# Patient Record
Sex: Male | Born: 1960 | Race: Black or African American | Hispanic: No | Marital: Married | Smoking: Never smoker
Health system: Southern US, Community
[De-identification: ages and names within clinical notes are randomized; demographics above are authoritative.]

## PROBLEM LIST (undated history)

## (undated) DIAGNOSIS — I509 Heart failure, unspecified: Secondary | ICD-10-CM

## (undated) DIAGNOSIS — I10 Essential (primary) hypertension: Secondary | ICD-10-CM

## (undated) DIAGNOSIS — I639 Cerebral infarction, unspecified: Secondary | ICD-10-CM

## (undated) DIAGNOSIS — E119 Type 2 diabetes mellitus without complications: Secondary | ICD-10-CM

## (undated) HISTORY — PX: COLON SURGERY: SHX602

## (undated) HISTORY — DX: Cerebral infarction, unspecified: I63.9

## (undated) HISTORY — DX: Heart failure, unspecified: I50.9

## (undated) HISTORY — PX: ABDOMINAL SURGERY: SHX537

---

## 2011-09-09 NOTE — ED Provider Notes (Signed)
HPI Comments: Patient ran out of his blood pressure medication two weeks ago.  He had a primary doctor, but he passed away, and he still hasn't followed up with a new doctor yet. He is supposed to take Amlodipine 5 mg daily, and Lisinopril 40 mg daily.  Since he ran out he has been getting occasional headaches that go away when he takes Tylenol, lays down, and rests for a little while. He doesn't want to be without his blood pressure medication any longer. No fever, chills, chest pain, SOB, difficulty breathing, current headache, nausea, vomiting, diaphoresis, or other symptoms or complaints.    Patient is a 51 y.o. male presenting with medication refill. The history is provided by the patient.   Medication Refill  This is a new problem. The current episode started more than 1 week ago. The problem occurs constantly. The problem has not changed since onset.Associated symptoms include headaches. Pertinent negatives include no chest pain, no abdominal pain and no shortness of breath. Nothing aggravates the symptoms. The symptoms are relieved by rest, acetaminophen and laying down. He has tried nothing for the symptoms.        Past Medical History   Diagnosis Date   ??? Hypertension         History reviewed. No pertinent past surgical history.      No family history on file.     History     Social History   ??? Marital Status: LEGALLY SEPARATED     Spouse Name: N/A     Number of Children: N/A   ??? Years of Education: N/A     Occupational History   ??? Not on file.     Social History Main Topics   ??? Smoking status: Not on file   ??? Smokeless tobacco: Not on file   ??? Alcohol Use:    ??? Drug Use:    ??? Sexually Active:      Other Topics Concern   ??? Not on file     Social History Narrative   ??? No narrative on file                  ALLERGIES: Review of patient's allergies indicates no known allergies.      Review of Systems   Constitutional: Negative for fever and chills.   HENT: Negative for neck pain and neck stiffness.    Eyes:  Negative for visual disturbance.   Respiratory: Negative for cough and shortness of breath.    Cardiovascular: Negative for chest pain and leg swelling.   Gastrointestinal: Negative for nausea, vomiting, abdominal pain, diarrhea and constipation.   Genitourinary: Negative for flank pain.   Musculoskeletal: Negative for back pain.   Skin: Negative for rash.   Neurological: Positive for headaches.   Hematological: Negative for adenopathy.   Psychiatric/Behavioral: Negative for agitation.       Filed Vitals:    09/09/11 2042   BP: 156/97   Pulse: 76   Temp: 98 ??F (36.7 ??C)   Resp: 16   Height: 6\' 2"  (1.88 m)   Weight: 147.419 kg (325 lb)   SpO2: 100%            Physical Exam   Nursing note and vitals reviewed.  Constitutional: He is oriented to person, place, and time. He appears well-developed and well-nourished. No distress.   Cardiovascular: Normal rate, regular rhythm, normal heart sounds and intact distal pulses.  Exam reveals no gallop and no friction rub.  No murmur heard.  Pulmonary/Chest: Effort normal and breath sounds normal. No respiratory distress. He has no wheezes. He has no rales.   Neurological: He is alert and oriented to person, place, and time.   Skin: Skin is warm and dry. No rash noted. He is not diaphoretic.   Psychiatric: He has a normal mood and affect. His behavior is normal. Judgment and thought content normal.        MDM     Differential Diagnosis; Clinical Impression; Plan:     Medication refill, hypertension.  Amount and/or Complexity of Data Reviewed:   Clinical lab tests:  Ordered and reviewed  Risk of Significant Complications, Morbidity, and/or Mortality:   Presenting problems:  Low  Diagnostic procedures:  Minimal  Management options:  Low  Progress:   Patient progress:  Stable      Procedures    9:31 PM  Reviewed treatment, medication, lab results, and follow up plan with patient. Answered patient's questions.  Patient is ready for discharge.    Recent Results (from the past 12  hour(s))   POC CHEM8    Collection Time    09/09/11  9:10 PM       Component Value Range    CO2 (POC) 25 (*) 19 - 24 MMOL/L    Glucose (POC) 104  74 - 106 MG/DL    BUN (POC) 16  7 - 18 MG/DL    Creatinine (POC) 1.2  0.6 - 1.3 MG/DL    GFR-AA (POC) >84  >69 ml/min/1.63m2    GFR, non-AA (POC) >60  >60 ml/min/1.59m2    Sodium (POC) 142  136 - 145 MMOL/L    Potassium (POC) 3.7  3.5 - 5.5 MMOL/L    Calcium, ionized (POC) 1.28  1.12 - 1.32 MMOL/L    Chloride (POC) 105  100 - 108 MMOL/L    Anion gap (POC) 16  10 - 20      Hematocrit (POC) 46  37.0 - 49.0 %    Hemoglobin (POC) 15.6  13.0 - 16.0 G/DL     Diagnosis:   1. Hypertension    2. Medication refill          Disposition: Home    Follow-up Information     Follow up With Details Comments Contact Info    Lincoln Brigham., DO in 1 week For blood pressure evaluation [address]  [address]  [facility]  [place] [address]  [phone]            Patient's Medications   Start Taking    AMLODIPINE (NORVASC) 5 MG TABLET    Take 1 Tab by mouth daily.    LISINOPRIL (PRINIVIL, ZESTRIL) 40 MG TABLET    Take 1 Tab by mouth daily.   Continue Taking    No medications on file   These Medications have changed    No medications on file   Stop Taking    AMLODIPINE (NORVASC) 5 MG TABLET    Take 5 mg by mouth daily.    LISINOPRIL (PRINIVIL, ZESTRIL) 40 MG TABLET    Take 40 mg by mouth daily.

## 2011-09-09 NOTE — ED Notes (Signed)
I have reviewed discharge instructions with the patient.  The patient verbalized understanding.

## 2011-09-09 NOTE — ED Notes (Signed)
POC Chem 8 completed PA Salyers given results

## 2011-09-09 NOTE — ED Notes (Signed)
Pt has no primary MD

## 2011-09-09 NOTE — ED Provider Notes (Signed)
I was personally available for consultation in the emergency department.  I have reviewed the chart and agree with the documentation recorded by the MLP, including the assessment, treatment plan, and disposition.  Sommer Spickard D Danner Paulding, MD

## 2011-09-10 LAB — POC CHEM8
Anion gap, POC: 16 (ref 10–20)
BUN, POC: 16 MG/DL (ref 7–18)
CO2, POC: 25 MMOL/L — ABNORMAL HIGH (ref 19–24)
Calcium, ionized (POC): 1.28 MMOL/L (ref 1.12–1.32)
Chloride, POC: 105 MMOL/L (ref 100–108)
Creatinine, POC: 1.2 MG/DL (ref 0.6–1.3)
GFRAA, POC: >60 mL/min/{1.73_m2} (ref >60)
GFRNA, POC: >60 mL/min/{1.73_m2} (ref >60)
Glucose, POC: 104 MG/DL (ref 74–106)
Hematocrit, POC: 46 % (ref 37.0–49.0)
Hemoglobin, POC: 15.6 G/DL (ref 13.0–16.0)
Potassium, POC: 3.7 MMOL/L (ref 3.5–5.5)
Sodium, POC: 142 MMOL/L (ref 136–145)

## 2011-09-10 MED ORDER — LISINOPRIL 40 MG TAB
40 mg | ORAL_TABLET | Freq: Every day | ORAL | Status: DC
Start: 2011-09-10 — End: 2014-02-24

## 2011-09-10 MED ORDER — AMLODIPINE 5 MG TAB
5 mg | ORAL_TABLET | Freq: Every day | ORAL | Status: DC
Start: 2011-09-10 — End: 2015-10-17

## 2011-09-28 NOTE — ED Provider Notes (Signed)
KNOWN ALLERGIES   NKDA       TRIAGE (Mon Sep 28, 2011 22:19 AVM0)   PATIENT: NAME: Jesse Ballard, AGE: 51, GENDER: male, DOB: Tue         12-May-1960, TIME OF GREET: Mon Sep 28, 2011 22:05, Nellie:         [place], [place]: [id number], KG WEIGHT: 145.1, MEDICAL RECORD NUMBER:         [phone], ACCOUNT NUMBER: [id number], PCP: Pt Denies,. (Mon Sep 28, 2011         22:19 AVM0)   ADMISSION: URGENCY: 4, TRANSPORT: Ambulatory, DEPT: Emergency,         BED: WAITING. (Mon Sep 28, 2011 22:19 AVM0)   VITAL SIGNS: BP 159/98, Pulse 89, Resp 16, Temp 99, Pain 8, O2         Sat 96, on Room air, Time 09/28/2011 22:16. (22:16 AVM0)   COMPLAINT:  Back Pain. (Mon Sep 28, 2011 22:19 AVM0)   PRESENTING COMPLAINT:  back pain w/ mvmt. (22:20 AVM0)   PAIN: Patient complains of pain, On a scale 0-10 patient rates         pain as 8, Onset was 0800, No modifying factors. (22:20 AVM0)   TB SCREENING: TB screen not applicable for this patient. (22:20         AVM0)   ABUSE SCREENING: Not Applicable. (22:20 AVM0)   FALL RISK: Patient has a low risk of falling. (22:20 AVM0)   SUICIDAL IDEATION: Not Applicable. (22:20 AVM0)   ADVANCE DIRECTIVES: Unknown if patient has advance directives.         (22:20 AVM0)   PROVIDERS: TRIAGE NURSE: Amy Minnick, RN. (Mon Sep 28, 2011 22:19         AVM0)       PRESENTING PROBLEM (22:19 AVM0)      Presenting problems: Back Injury-Pain-Swelling.       CURRENT MEDICATIONS (22:19 AVM0)   HTN med x2- unk names       NURSING PROCEDURE: DISCHARGE NOTE (23:07 WAB1)   DISCHARGE: Patient discharged to home, ambulating without         assistance, driving self, unaccompanied, Summary of Care printed/         provided, Discharge instructions given to patient, Simple or moderate         discharge teaching performed, by w.bennetch RN, Prescriptions given         and instructions on side effects given, Name of prescription(s)         given: flexeril, ibuprofen, vicodin, Above person(s) verbalized          understanding of discharge instructions and follow-up care, Patient         treated and evaluated by physician.       DIAGNOSIS (22:57 COL1)   FINAL: PRIMARY: Thoracic strain (T-spine strain).       DISPOSITION   PATIENT:  Disposition Type: Discharged, Disposition: Discharge,         Condition: Stable. (22:57 COL1)      Patient left the department. (23:08 WAB1)       VITAL SIGNS (22:16 AVM0)   VITAL SIGNS: BP: 159/98, Pulse: 89, Resp: 16, Temp: 99, Pain: 8,         O2 sat: 96 on Room air, Time: 09/28/2011 22:16.       INSTRUCTION (22:58 COL1)   DISCHARGE:  THORACIC STRAIN - WITHOUT X-RAYS (BACK STRAIN, PULLED         MUSCLE).   FOLLOWUP:  [facility], [address]         [place], [place] [address], [phone].   SPECIAL:  Ibuprofen for pain and inflammation.         Vicodin for SEVERE pain.         Flexeril for muscle spasms.         Avoid heavy lifting.         Follow up with primary care physician in 1 week if not improving.         Do not drive or operate machinery while medicated on Flexeril or         Vicodin.         Return to the ER if condition worsens or new symptoms develop.       PRESCRIPTION (22:57 COL1)   Flexeril:  Tablet : 10 mg : Oral : Quantity: *** 1 *** Unit:         tab(s) Route: Oral Schedule: See Notes Dispense: *** 15 ***.   NOTES:  TID prn muscle spasms. Max: 30 mg/day          No refills          May use generic.   Ibuprofen:  Tablet : 600 Mg : Oral : Quantity: *** 1 *** Unit:         tab(s) Route: Oral Schedule: As needed every 6 hours Dispense: *** 30         ***.   NOTES:  TID with food prn pain          No refills          May use generic.   Vicodin:  Tablet : 500 mg-5 mg : Oral : Quantity: *** 1 *** Unit:         tab Route: Oral Schedule: Q6HPRN Dispense: *** 12 ***.   NOTES:  prn severe pain          No refills          May use generic.       ADMIN (23:08 WAB1)   DIGITAL SIGNATURE:  Bennetch, RN, Whitney.   Key:      AVM0=Minnick, RN, Amy  COL1=OLeary, PA-C, Colleen  WAB1=Bennetch, RN,     [employer]

## 2011-11-07 LAB — TROPONIN I: Troponin-I, QT: <0.02 NG/ML (ref 0.0–0.045)

## 2011-11-07 MED ORDER — METOCLOPRAMIDE 5 MG/ML IJ SOLN
5 mg/mL | INTRAMUSCULAR | Status: AC
Start: 2011-11-07 — End: 2011-11-07
  Administered 2011-11-07: 20:00:00 via INTRAVENOUS

## 2011-11-07 MED ORDER — DIPHENHYDRAMINE HCL 50 MG/ML IJ SOLN
50 mg/mL | Freq: Once | INTRAMUSCULAR | Status: AC
Start: 2011-11-07 — End: 2011-11-07
  Administered 2011-11-07: 20:00:00 via INTRAVENOUS

## 2011-11-07 MED ORDER — LISINOPRIL 40 MG TAB
40 mg | ORAL_TABLET | Freq: Every day | ORAL | Status: DC
Start: 2011-11-07 — End: 2012-06-20

## 2011-11-07 MED ORDER — AMLODIPINE 5 MG TAB
5 mg | ORAL_TABLET | Freq: Every day | ORAL | Status: DC
Start: 2011-11-07 — End: 2012-06-20

## 2011-11-07 MED ORDER — BUTALBITAL-ACETAMINOPHEN-CAFFEINE 50 MG-500 MG-40 MG TAB
50-500-40 mg | ORAL_TABLET | Freq: Four times a day (QID) | ORAL | Status: DC | PRN
Start: 2011-11-07 — End: 2015-10-17

## 2011-11-07 MED ORDER — KETOROLAC TROMETHAMINE 30 MG/ML INJECTION
30 mg/mL (1 mL) | INTRAMUSCULAR | Status: AC
Start: 2011-11-07 — End: 2011-11-07
  Administered 2011-11-07: 20:00:00 via INTRAVENOUS

## 2011-11-07 MED FILL — KETOROLAC TROMETHAMINE 30 MG/ML INJECTION: 30 mg/mL (1 mL) | INTRAMUSCULAR | Qty: 7

## 2011-11-07 MED FILL — DIPHENHYDRAMINE HCL 50 MG/ML IJ SOLN: 50 mg/mL | INTRAMUSCULAR | Qty: 1

## 2011-11-07 MED FILL — METOCLOPRAMIDE 5 MG/ML IJ SOLN: 5 mg/mL | INTRAMUSCULAR | Qty: 2

## 2011-11-07 NOTE — ED Notes (Signed)
I have reviewed discharge instructions with the patient.  The patient verbalized understanding.Patient armband removed and shredded

## 2011-11-07 NOTE — ED Provider Notes (Signed)
HPI Comments: 51yo male with PMH HTN presents to the ED c/o headache x 4 days, sharp intermittent chest pain and left arm numbness onset yesterday. Pt states he has had a pounding headache at the top of his head x 4 days. Pt states he gets dizzy, sees yellow dots and then the headache begins. Pt states he has photophobia with headache. Pt denies nausea. Pt states took 4 Ibuprofen without relief. Pt states he has never had a headache lasting 4 days and does not normally see yellow dots. Pt states he has had 3 episodes of chest pain since yesterday. Pt states chest pain is located in left pectoral area and last a few minutes. Pt states chest pain is worse with inspiration. Pt denies chest pain occuring with exertion or heat exposure. Pt states his left arm will get numb and if he squeezes his hand he gets feeling back. Pt is unsure if left hand numbness occurs with chest pain. No other complaints at this time.    Patient is a 51 y.o. male presenting with chest pain. The history is provided by the patient.   Chest Pain (Angina)   Associated symptoms include dizziness, headaches and numbness. Pertinent negatives include no abdominal pain, no fever, no nausea, no shortness of breath and no vomiting.        Past Medical History   Diagnosis Date   ??? Hypertension         History reviewed. No pertinent past surgical history.      History reviewed. No pertinent family history.     History     Social History   ??? Marital Status: LEGALLY SEPARATED     Spouse Name: N/A     Number of Children: N/A   ??? Years of Education: N/A     Occupational History   ??? Not on file.     Social History Main Topics   ??? Smoking status: Never Smoker    ??? Smokeless tobacco: Not on file   ??? Alcohol Use:    ??? Drug Use: No   ??? Sexually Active:      Other Topics Concern   ??? Not on file     Social History Narrative   ??? No narrative on file                  ALLERGIES: Review of patient's allergies indicates no known allergies.      Review of Systems    Constitutional: Negative for fever and chills.   HENT: Negative for congestion and sore throat.    Eyes: Negative for redness and visual disturbance.   Respiratory: Negative for shortness of breath.    Cardiovascular: Positive for chest pain.   Gastrointestinal: Negative for nausea, vomiting, abdominal pain and diarrhea.   Genitourinary: Negative for difficulty urinating.   Musculoskeletal: Negative for myalgias and arthralgias.   Skin: Negative for rash.   Neurological: Positive for dizziness, numbness and headaches.   Psychiatric/Behavioral: Negative for dysphoric mood.   All other systems reviewed and are negative.        Filed Vitals:    11/07/11 1342   BP: 145/93   Pulse: 88   Temp: 97.8 ??F (36.6 ??C)   Resp: 18   Height: 6\' 3"  (1.905 m)   Weight: 149.687 kg (330 lb)   SpO2: 98%            Physical Exam   Nursing note and vitals reviewed.  Constitutional: He is oriented to person,  place, and time. He appears well-developed and well-nourished. No distress.   HENT:   Head: Normocephalic and atraumatic.   Nose: Nose normal.   Eyes: Pupils are equal, round, and reactive to light. Right eye exhibits no discharge. Left eye exhibits no discharge. No scleral icterus.   Neck: Neck supple. No JVD present.   Cardiovascular: Normal rate, regular rhythm and normal heart sounds.  Exam reveals no gallop and no friction rub.    No murmur heard.  Pulmonary/Chest: Effort normal and breath sounds normal. No respiratory distress. He has no wheezes. He has no rales.   Abdominal: Soft. He exhibits no mass. There is no tenderness. There is no rebound and no guarding.   Musculoskeletal: He exhibits no edema and no tenderness.   Neurological: He is alert and oriented to person, place, and time. He exhibits normal muscle tone. Coordination normal.   Skin: Skin is warm and dry. No rash noted. He is not diaphoretic.   Psychiatric: He has a normal mood and affect.        MDM     Differential Diagnosis; Clinical Impression; Plan:     IMP:  Headache w/ typical features for migraine. Denies prior h/o same. Chest pain unlikely cardiac and is not chief complaint at this time. Pt describes brief episodes of "sharp" parasternal pain c/w chest wall.  Risk of Significant Complications, Morbidity, and/or Mortality:   Presenting problems:  Moderate  Diagnostic procedures:  Moderate  Management options:  Moderate  General Comments: ED Course: EKG, labs and head CT results reviewed.      Procedures    -------------------------------------------------------------------------------------------------------------------     EKG INTERPRETATIONS:  NSR, 86, normal intervals and axis, no ischemic change    RESULTS:   Wet Read CT Head  1. No acute intracranial abnormality.  2. Minimal amount of right posterior ethmoid mucosal thickening.     Recent Results (from the past 12 hour(s))   TROPONIN I    Collection Time    11/07/11  3:21 PM       Component Value Range    Troponin-I, Qt. <0.02  0.0 - 0.045 NG/ML     CXR: NAD per rad    CONSULTATIONS:      PROGRESS NOTES:  3:16 PM: Discussed treatment plan with the patient.     SCRIBE ATTESTATION STATEMENT:  3:22 PM  Provider documentation written by: Donell Beers  Acting as a scribe for Dr. Karma Ganja, MD ED Provider     I have reviewed the information recorded by the scribe and agree with its contents.    Harriet Pho MD    -------------------------------------------------------------------------------------------------------------------     Diagnosis:   1. Migraine    2. Atypical chest pain    3. Blood pressure elevated      Plan:  1) Continue current BP medicines  2) Fioricet for migraine  3) Your head CT was normal  4) Return for worsening headache, persistent chest pain, acutely worsening problems  5) Follow up with your PCP or [facility] for recheck and repeat BP    Disposition: home    Follow-up Information     Follow up With Details Comments Contact Info    [facility]   [address]  [place] [address]  [phone]          Patient's Medications   Start Taking    BUTALBITAL-ACETAMINOPHEN-CAFFEINE (ESGIC PLUS) 50-500-40 MG PER TABLET    Take 2 Tabs  by mouth every six (6) hours as needed for Headache.   Continue Taking    AMLODIPINE (NORVASC) 5 MG TABLET    Take 1 Tab by mouth daily.    LISINOPRIL (PRINIVIL, ZESTRIL) 40 MG TABLET    Take 1 Tab by mouth daily.   These Medications have changed    No medications on file   Stop Taking    No medications on file

## 2011-11-07 NOTE — ED Notes (Signed)
1. Headache  2/ chest pain  3. Left arm pain/numbness  4. Super dizzy spells

## 2011-11-08 LAB — EKG, 12 LEAD, INITIAL
Atrial Rate: 344 {beats}/min
Calculated P Axis: 77 degrees
Calculated R Axis: 56 degrees
Calculated T Axis: 33 degrees
Diagnosis: NORMAL
Q-T Interval: 354 ms
QRS Duration: 86 ms
QTC Calculation (Bezet): 423 ms
Ventricular Rate: 86 {beats}/min

## 2012-06-20 LAB — INFLUENZA A & B AG (RAPID TEST)
Influenza A Antigen: POSITIVE — AB
Influenza B Antigen: NEGATIVE

## 2012-06-20 MED ADMIN — acetaminophen (TYLENOL) tablet 1,000 mg: ORAL | @ 23:00:00 | NDC 00904198861

## 2012-06-20 MED ADMIN — lisinopril (PRINIVIL, ZESTRIL) tablet 40 mg: ORAL | NDC 68084006211

## 2012-06-20 MED ADMIN — amLODIPine (NORVASC) tablet 5 mg: ORAL | NDC 59762153006

## 2012-06-20 MED FILL — MAPAP EXTRA STRENGTH 500 MG TABLET: 500 mg | ORAL | Qty: 2

## 2012-06-20 NOTE — ED Provider Notes (Signed)
I was personally available for consultation in the emergency department. I have reviewed the chart prior to the patient's discharge and agree with the documentation recorded by the MLP, including the assessment, treatment plan, and disposition.

## 2012-06-20 NOTE — ED Notes (Signed)
Pt here for c/o cough/ cold sx with low grade fever that started yest.

## 2012-06-20 NOTE — ED Provider Notes (Addendum)
HPI Comments: Well-appearing, here reporting cough, subjective fever and general malaise that began yesterday.  Pt denies  ear pain, sore throat, CP, SOB, wheezing, hemoptysis, abdominal or back pain, n/v/d or other associated s/sx. Pt notes good PO intake, but difficulty sleeping secondary to cough Over the counter medication with minimal relief. No flu shot. PT states he ran out of medications for BP yesterday, but reports usual compliance.  No PCP.  No other concerns    Patient is a 52 y.o. male presenting with fever and cough. The history is provided by the patient.   Fever   The problem occurs constantly. Associated symptoms include cough.   Cough         Past Medical History   Diagnosis Date   ??? Hypertension         No past surgical history on file.      No family history on file.     History     Social History   ??? Marital Status: MARRIED     Spouse Name: N/A     Number of Children: N/A   ??? Years of Education: N/A     Occupational History   ??? Not on file.     Social History Main Topics   ??? Smoking status: Never Smoker    ??? Smokeless tobacco: Not on file   ??? Alcohol Use:    ??? Drug Use: No   ??? Sexually Active:      Other Topics Concern   ??? Not on file     Social History Narrative   ??? No narrative on file                  ALLERGIES: Review of patient's allergies indicates no known allergies.      Review of Systems   Constitutional: Positive for fever.   Respiratory: Positive for cough.         All other systems reviewed and are negative as indicated in HPI.       Filed Vitals:    06/20/12 1704   BP: 151/101   Pulse: 108   Temp: 99.4 ??F (37.4 ??C)   Resp: 16   Height: 6\' 3"  (1.905 m)   Weight: 154.223 kg (340 lb)   SpO2: 98%            Physical Exam   Nursing note and vitals reviewed.  Constitutional: He is oriented to person, place, and time. He appears well-developed and well-nourished. No distress.   HENT:   Head: Normocephalic and atraumatic.   Mouth/Throat: No oropharyngeal exudate.   Eyes: No scleral icterus.    Neck: Normal range of motion. Neck supple.   No midline tenderness to palption   Cardiovascular: Normal rate and regular rhythm.  Exam reveals no gallop and no friction rub.    No murmur heard.  Pulmonary/Chest: Effort normal and breath sounds normal. He has no wheezes. He has no rales.   Abdominal: Soft. Bowel sounds are normal. He exhibits no distension. There is no tenderness. There is no guarding.   Genitourinary:   No flank tenderness bilaterally   Musculoskeletal: Normal range of motion.   Neurological: He is alert and oriented to person, place, and time. No cranial nerve deficit (No apparent deficits).   Skin: Skin is warm and dry. He is not diaphoretic.   Psychiatric: He has a normal mood and affect. His behavior is normal. Judgment and thought content normal.  MDM     Amount and/or Complexity of Data Reviewed:   Clinical lab tests:  Ordered and reviewed (  Rapid flu positive  Reviewed with pt) (    Pt appears well overall  Afebrile  Note elevated pulse and BP.  Pt reports "ran out of BP meds yesterday".  Pt denies cardiac symptoms currently. Gave Lisinopril and Norvasc  in the ER.   Will recheck prior to DC. Pt in NAD.  Improved.  Safe to DC to home  Physical exam is unremarkable    DC with Rx for Tamiflu, Norvasc, Lisinopril and Cheratussin.  Referral to [facility]  Discussed return precautions.  Pt indicates understanding and agrees. )      Procedures

## 2012-06-20 NOTE — Other (Signed)
I have reviewed discharge instructions with the patient.  The patient verbalized understanding.

## 2012-06-21 MED FILL — AMLODIPINE 5 MG TAB: 5 mg | ORAL | Qty: 1

## 2012-06-21 MED FILL — LISINOPRIL 20 MG TAB: 20 mg | ORAL | Qty: 2

## 2014-02-24 DIAGNOSIS — E1165 Type 2 diabetes mellitus with hyperglycemia: Secondary | ICD-10-CM

## 2014-02-25 ENCOUNTER — Inpatient Hospital Stay
Admit: 2014-02-25 | Discharge: 2014-02-25 | Disposition: A | Discharge status: Home / Self Care | Payer: Self-pay | Attending: Emergency Medicine

## 2014-02-25 LAB — CBC WITH AUTOMATED DIFF
ABS. BASOPHILS: 0 10*3/uL (ref 0.0–0.06)
ABS. EOSINOPHILS: 0.1 10*3/uL (ref 0.0–0.4)
ABS. LYMPHOCYTES: 1.6 10*3/uL (ref 0.9–3.6)
ABS. MONOCYTES: 0.4 10*3/uL (ref 0.05–1.2)
ABS. NEUTROPHILS: 2.6 10*3/uL (ref 1.8–8.0)
BASOPHILS: 0 % (ref 0–2)
EOSINOPHILS: 1 % (ref 0–5)
HCT: 46.2 % (ref 36.0–48.0)
HGB: 15.2 g/dL (ref 13.0–16.0)
LYMPHOCYTES: 34 % (ref 21–52)
MCH: 25.6 PG (ref 24.0–34.0)
MCHC: 32.9 g/dL (ref 31.0–37.0)
MCV: 77.9 FL (ref 74.0–97.0)
MONOCYTES: 8 % (ref 3–10)
MPV: 10.6 FL (ref 9.2–11.8)
NEUTROPHILS: 57 % (ref 40–73)
PLATELET: 220 10*3/uL (ref 135–420)
RBC: 5.93 M/uL — ABNORMAL HIGH (ref 4.70–5.50)
RDW: 13.8 % (ref 11.6–14.5)
WBC: 4.6 10*3/uL (ref 4.6–13.2)

## 2014-02-25 LAB — METABOLIC PANEL, BASIC
Anion gap: 14 mmol/L (ref 3.0–18)
BUN/Creatinine ratio: 11 — ABNORMAL LOW (ref 12–20)
BUN: 16 MG/DL (ref 7.0–18)
CO2: 27 mmol/L (ref 21–32)
Calcium: 9.7 MG/DL (ref 8.5–10.1)
Chloride: 93 mmol/L — ABNORMAL LOW (ref 100–108)
Creatinine: 1.4 MG/DL — ABNORMAL HIGH (ref 0.6–1.3)
GFR est AA: >60 mL/min/{1.73_m2} (ref >60)
GFR est non-AA: 57 mL/min/{1.73_m2} — ABNORMAL LOW (ref >60)
Glucose: 423 mg/dL — CR (ref 74–99)
Potassium: 4.4 mmol/L (ref 3.5–5.5)
Sodium: 134 mmol/L — ABNORMAL LOW (ref 136–145)

## 2014-02-25 LAB — GLUCOSE, POC
Glucose (POC): 395 mg/dL — ABNORMAL HIGH (ref 70–110)
Glucose (POC): 312 mg/dL — ABNORMAL HIGH (ref 70–110)

## 2014-02-25 LAB — PTT: aPTT: 25.5 s (ref 24.6–37.7)

## 2014-02-25 LAB — PROTHROMBIN TIME + INR
INR: 1 (ref 0.8–1.2)
Prothrombin time: 13.3 s (ref 11.5–15.2)

## 2014-02-25 MED ORDER — INSULIN REGULAR HUMAN 100 UNIT/ML INJECTION
100 unit/mL | INTRAMUSCULAR | Status: AC
Start: 2014-02-25 — End: 2014-02-25
  Administered 2014-02-25: 06:00:00 via INTRAVENOUS

## 2014-02-25 MED ORDER — METHOCARBAMOL 500 MG TAB
500 mg | ORAL_TABLET | Freq: Four times a day (QID) | ORAL | Status: DC
Start: 2014-02-25 — End: 2015-10-17

## 2014-02-25 MED FILL — INSULIN REGULAR HUMAN 100 UNIT/ML INJECTION: 100 unit/mL | INTRAMUSCULAR | Qty: 1

## 2014-02-25 NOTE — ED Provider Notes (Signed)
HPI Comments: Jesse Ballard is a 53 y.o. male with a history of IDDM, HTN, thromboembolus who presents to the emergency department with c/o left ankle and calf swelling with pain x "a while."  He also reports that he ran out of insulin yesterday.  He reports he does have refills though.  He just hasn't filled it.  This morning, he relates a short episode of dizziness with blurry vision that immediately resolved.  He is not dizzy or experiencing blurred vision at this time.  He states "everything is clear."  He also reports that he gets some low back pain when he is walking for long periods of time.  Pt denies any fevers or chills, headache, ENT issues, CP or discomfort, SOB, cough, n/v/d/c, abd pain, melena/hematochezia, dysuria, hematuria, focal weakness/numbness/tingling, or rash.  Patient has no other complaints at this time.        Patient is a 53 y.o. male presenting with lower extremity edema, medication refill, and dizziness.   Leg Swelling   Associated symptoms include back pain. Pertinent negatives include no numbness.   Medication Refill  Pertinent negatives include no chest pain, no abdominal pain, no headaches and no shortness of breath.   Dizziness  Pertinent negatives include no agitation. Pertinent negatives include no shortness of breath, no chest pain, no vomiting, no headaches and no nausea.        Past Medical History   Diagnosis Date   ??? Hypertension    ??? Diabetes (HCC)    ??? Thromboembolus [facility])         Past Surgical History   Procedure Laterality Date   ??? Pr abdomen surgery proc unlisted           History reviewed. No pertinent family history.     History     Social History   ??? Marital Status: DIVORCED     Spouse Name: N/A     Number of Children: N/A   ??? Years of Education: N/A     Occupational History   ??? Not on file.     Social History Main Topics   ??? Smoking status: Never Smoker    ??? Smokeless tobacco: Not on file   ??? Alcohol Use: No   ??? Drug Use: No   ??? Sexual Activity: Not on file      Other Topics Concern   ??? Not on file     Social History Narrative                  ALLERGIES: Review of patient's allergies indicates no known allergies.      Review of Systems   Constitutional: Negative for fever and chills.   HENT: Negative for congestion, rhinorrhea and sore throat.    Eyes: Positive for visual disturbance.   Respiratory: Negative for cough and shortness of breath.    Cardiovascular: Positive for leg swelling. Negative for chest pain.   Gastrointestinal: Negative for nausea, vomiting, abdominal pain, diarrhea and constipation.   Genitourinary: Negative for dysuria and hematuria.   Musculoskeletal: Positive for back pain and arthralgias. Negative for myalgias and gait problem.   Skin: Negative for rash and wound.   Neurological: Positive for dizziness. Negative for syncope, weakness, numbness and headaches.   Psychiatric/Behavioral: Negative for behavioral problems and agitation.       Filed Vitals:    02/24/14 2252   BP: 153/89   Pulse: 110   Temp: 97.9 ??F (36.6 ??C)   Resp: 14  Height: 6' 2.5" (1.892 m)   Weight: 154.223 kg (340 lb)   SpO2: 98%            Physical Exam   Constitutional: He is oriented to person, place, and time. He appears well-developed and well-nourished. No distress.   Pt is obese   HENT:   Head: Normocephalic and atraumatic.   Nose: Nose normal.   Mouth/Throat: Oropharynx is clear and moist. No oropharyngeal exudate.   Eyes: Conjunctivae and EOM are normal. Pupils are equal, round, and reactive to light. Right eye exhibits no discharge. Left eye exhibits no discharge. Right eye exhibits normal extraocular motion and no nystagmus. Left eye exhibits normal extraocular motion and no nystagmus. Right pupil is round and reactive. Left pupil is round and reactive. Pupils are equal.   Neck: Normal range of motion. Neck supple. No thyromegaly present.   Cardiovascular: Normal rate, normal heart sounds and intact distal pulses.  Exam reveals no gallop and no friction rub.     No murmur heard.  Pulmonary/Chest: Effort normal and breath sounds normal. No respiratory distress. He has no wheezes. He has no rales. He exhibits no tenderness.   Abdominal: Soft. Bowel sounds are normal.   Musculoskeletal: Normal range of motion. He exhibits edema. He exhibits no tenderness.   Minimal TTP with 1+ pitting edema L>R.  No obvious deformity, ecchymosis, discoloration, or erythema noted.  Normal skin exam of LLE.  Pt ambulatory with 5/5 strength, normal ROM, and neurovascularly intact distally.     Lymphadenopathy:     He has no cervical adenopathy.   Neurological: He is alert and oriented to person, place, and time. No cranial nerve deficit.   Alert and oriented x 3.  CNs 2-12 intact. Pt ambulatory, moving all extremities with equal bilateral ROM, 5/5 strength, and intact distal neurovascular status.   Skin: Skin is warm and dry. No rash noted. He is not diaphoretic.   Psychiatric: He has a normal mood and affect. His behavior is normal.   Nursing note and vitals reviewed.       MDM  Number of Diagnoses or Management Options  Bilateral low back pain without sciatica: minor  Edema of left lower extremity: minor  Hyperglycemia: minor  Noncompliance with medication regimen: minor  Diagnosis management comments: Differential Diagnosis:  DVT, dependent edema, cellulitis, erysipelas, folliculitis, impetigo, osteomyelitis, septic arthritis, lymphadenitis, lymphangitis, allergic reaction, DVT, necrotizing fasciitis, venous insufficiency    Plan:  No documentation of DVT noted on either [facility] or [place] records.  Exam is not consistent with acute DVT here.  Exam here is more consistent with mild dependent edema, L>R.  He reports pain is mainly in the left ankle.  Will strongly advise patient to keep taking his coumadin and follow-up with his PCP at [facility] to discuss why he is not therapeutic.  Swelling in legs and pain in lower back with walking long  distances. Hyperglycemic here.  10mg  IV insulin given with reduction in BG from 395 down to 312.  Pt is once again strongly advised to make sure to get his insulin refilled as he does have a refill.  Will give robaxin for occasional back pain.  Patient demonstrates understanding of current diagnoses and is in agreement with the treatment plan.  They are advised that while the likelihood of serious underlying condition is low at this point given the evaluation performed today, we cannot fully rule it out.  They are advised to immediately return with any new symptoms  or worsening of current condition.  All questions have been answered.  Patient is given educational material regarding their diagnoses, including danger symptoms and when to return to the ED.            Amount and/or Complexity of Data Reviewed  Clinical lab tests: ordered and reviewed  Decide to obtain previous medical records or to obtain history from someone other than the patient: yes  Review and summarize past medical records: yes    Risk of Complications, Morbidity, and/or Mortality  Presenting problems: moderate  Diagnostic procedures: moderate  Management options: moderate    Patient Progress  Patient progress: improved      Procedures    -------------------------------------------------------------------------------------------------------------------  Orders:  Orders Placed This Encounter   ??? CBC WITH AUTOMATED DIFF     Standing Status: Standing      Number of Occurrences: 1      Standing Expiration Date:    ??? METABOLIC PANEL, BASIC     Standing Status: Standing      Number of Occurrences: 1      Standing Expiration Date:    ??? PTT     Standing Status: Standing      Number of Occurrences: 1      Standing Expiration Date:    ??? PROTHROMBIN TIME + INR     Standing Status: Standing      Number of Occurrences: 1      Standing Expiration Date:    ??? POC GLUCOSE     Standing Status: Standing      Number of Occurrences: 1      Standing Expiration Date:     ??? GLUCOSE, POC     Standing Status: Standing      Number of Occurrences: 1      Standing Expiration Date:    ??? POC GLUCOSE     Standing Status: Standing      Number of Occurrences: 1      Standing Expiration Date:    ??? GLUCOSE, POC     Standing Status: Standing      Number of Occurrences: 1      Standing Expiration Date:    ??? lisinopril-hydrochlorothiazide (PRINZIDE, ZESTORETIC) 20-12.5 mg per tablet     Sig: Take  by mouth daily.   ??? metFORMIN (GLUCOPHAGE) 500 mg tablet     Sig: Take 500 mg by mouth two (2) times daily (with meals).   ??? warfarin (COUMADIN) 10 mg tablet     Sig: Take 10 mg by mouth daily.   ??? simvastatin (ZOCOR) 20 mg tablet     Sig: Take 20 mg by mouth nightly.   ??? insulin NPH/insulin regular (NOVOLIN 70/30) 100 unit/mL (70-30) injection     Sig: 50 Units by SubCUTAneous route.   ??? insulin regular (NOVOLIN R, HUMULIN R) injection 10 Units     Sig:    ??? methocarbamol (ROBAXIN) 500 mg tablet     Sig: Take 1 Tab by mouth four (4) times daily.     Dispense:  20 Tab     Refill:  0      Lab Results:   Recent Results (from the past 12 hour(s))   GLUCOSE, POC    Collection Time: 02/24/14 11:02 PM   Result Value Ref Range    Glucose (POC) 395 (H) 70 - 110 mg/dL   CBC WITH AUTOMATED DIFF    Collection Time: 02/25/14  1:03 AM   Result Value Ref Range  WBC 4.6 4.6 - 13.2 K/uL    RBC 5.93 (H) 4.70 - 5.50 M/uL    HGB 15.2 13.0 - 16.0 g/dL    HCT 54.0 98.1 - 19.1 %    MCV 77.9 74.0 - 97.0 FL    MCH 25.6 24.0 - 34.0 PG    MCHC 32.9 31.0 - 37.0 g/dL    RDW 47.8 29.5 - 62.1 %    PLATELET 220 135 - 420 K/uL    MPV 10.6 9.2 - 11.8 FL    NEUTROPHILS 57 40 - 73 %    LYMPHOCYTES 34 21 - 52 %    MONOCYTES 8 3 - 10 %    EOSINOPHILS 1 0 - 5 %    BASOPHILS 0 0 - 2 %    ABS. NEUTROPHILS 2.6 1.8 - 8.0 K/UL    ABS. LYMPHOCYTES 1.6 0.9 - 3.6 K/UL    ABS. MONOCYTES 0.4 0.05 - 1.2 K/UL    ABS. EOSINOPHILS 0.1 0.0 - 0.4 K/UL    ABS. BASOPHILS 0.0 0.0 - 0.06 K/UL    DF AUTOMATED     METABOLIC PANEL, BASIC     Collection Time: 02/25/14  1:03 AM   Result Value Ref Range    Sodium 134 (L) 136 - 145 mmol/L    Potassium 4.4 3.5 - 5.5 mmol/L    Chloride 93 (L) 100 - 108 mmol/L    CO2 27 21 - 32 mmol/L    Anion gap 14 3.0 - 18 mmol/L    Glucose 423 (HH) 74 - 99 mg/dL    BUN 16 7.0 - 18 MG/DL    Creatinine 3.08 (H) 0.6 - 1.3 MG/DL    BUN/Creatinine ratio 11 (L) 12 - 20      GFR est AA >60 >60 ml/min/1.32m2    GFR est non-AA 57 (L) >60 ml/min/1.60m2    Calcium 9.7 8.5 - 10.1 MG/DL   PTT    Collection Time: 02/25/14  1:03 AM   Result Value Ref Range    aPTT 25.5 24.6 - 37.7 SEC   PROTHROMBIN TIME + INR    Collection Time: 02/25/14  1:03 AM   Result Value Ref Range    Prothrombin time 13.3 11.5 - 15.2 sec    INR 1.0 0.8 - 1.2     GLUCOSE, POC    Collection Time: 02/25/14  3:08 AM   Result Value Ref Range    Glucose (POC) 312 (H) 70 - 110 mg/dL     Progress Notes:  6:57 AM:  Michiel Sites, PA-C was at the pt's bedside, assessed the pt and answered the pt's questions regarding treatment.    -------------------------------------------------------------------------------------------------------------------    Disposition:  Diagnosis:   1. Hyperglycemia    2. Edema of left lower extremity    3. Noncompliance with medication regimen    4. Bilateral low back pain without sciatica        Disposition: DC Home    Follow-up Information     Follow up With Details Comments Contact Info    [facility] Call in 2 days For follow-up [address]  [place] [address]  [phone]    [facility] EMERGENCY DEPT  Please return immediately to the Emergency Room for re-evaluation if you develop any new symptoms or worsening of current symptoms! [address]  [place] [address]  [phone]          Current Discharge Medication List  START taking these medications    Details   methocarbamol (ROBAXIN) 500 mg tablet Take 1 Tab by mouth four (4) times daily.  Qty: 20 Tab, Refills: 0          CONTINUE these medications which have NOT CHANGED    Details   lisinopril-hydrochlorothiazide (PRINZIDE, ZESTORETIC) 20-12.5 mg per tablet Take  by mouth daily.      metFORMIN (GLUCOPHAGE) 500 mg tablet Take 500 mg by mouth two (2) times daily (with meals).      warfarin (COUMADIN) 10 mg tablet Take 10 mg by mouth daily.      simvastatin (ZOCOR) 20 mg tablet Take 20 mg by mouth nightly.      insulin NPH/insulin regular (NOVOLIN 70/30) 100 unit/mL (70-30) injection 50 Units by SubCUTAneous route.      amLODIPine (NORVASC) 5 mg tablet Take 1 Tab by mouth daily.  Qty: 30 Tab, Refills: 0      butalbital-acetaminophen-caffeine (ESGIC PLUS) 50-500-40 mg per tablet Take 2 Tabs by mouth every six (6) hours as needed for Headache.  Qty: 20 Tab, Refills: 0

## 2014-02-25 NOTE — ED Notes (Signed)
Pt ambulated to restroom w/ no complaints/symptoms.

## 2014-02-25 NOTE — ED Notes (Signed)
Provided pt w/ blanket, relaxing in stretcher. Call light within reach.

## 2014-02-25 NOTE — ED Notes (Signed)
Pt being discharged w/ full understanding of D/C instructions. Given both verbal and written information. Pt understands to F/U in ED for any new or worsening symptoms and to not drive or operate heavy machinery while taking sedating/narcotic meds. IV site post-removal WNL; Dressed w/ gauze & tape. Pt leaving ED now in stable condition, ambulatory, w/ no further questions or concerns.

## 2015-10-17 ENCOUNTER — Inpatient Hospital Stay
Admit: 2015-10-17 | Discharge: 2015-10-17 | Disposition: A | Discharge status: Home / Self Care | Payer: Self-pay | Attending: Emergency Medicine

## 2015-10-17 DIAGNOSIS — K029 Dental caries, unspecified: Secondary | ICD-10-CM

## 2015-10-17 LAB — GLUCOSE, POC: Glucose (POC): 127 mg/dL — ABNORMAL HIGH (ref 70–110)

## 2015-10-17 MED ORDER — LISINOPRIL 20 MG TAB
20 mg | ORAL_TABLET | Freq: Two times a day (BID) | ORAL | 0 refills | Status: AC
Start: 2015-10-17 — End: ?

## 2015-10-17 MED ORDER — HYDROCODONE-ACETAMINOPHEN 5 MG-325 MG TAB
5-325 mg | ORAL_TABLET | ORAL | 0 refills | Status: AC | PRN
Start: 2015-10-17 — End: ?

## 2015-10-17 MED ORDER — PENICILLIN V-K 500 MG TAB
500 mg | ORAL_TABLET | Freq: Four times a day (QID) | ORAL | 0 refills | Status: AC
Start: 2015-10-17 — End: 2015-10-24

## 2015-10-17 MED ORDER — METFORMIN 1,000 MG TAB
1000 mg | ORAL_TABLET | Freq: Two times a day (BID) | ORAL | 0 refills | Status: AC
Start: 2015-10-17 — End: ?

## 2015-10-17 NOTE — ED Triage Notes (Addendum)
Patient reports top cheek pain from dental pain and needs antibiotics until he gets to the dentist. Patient also needs a refill for metformin and lisinopril. He has not been able to get in contact with his pcp.

## 2015-10-17 NOTE — ED Notes (Signed)
I have reviewed discharge instructions and medications with the patient. The patient verbalized understanding.Patient is stable and in good condition with normal vital signs. Patient chose to discharge with their wristband; privacy risks were discussed. Patient escorted to lobby.

## 2015-10-17 NOTE — ED Provider Notes (Signed)
HPI Comments: 2:59 PM Jesse Ballard is a 55 y.o. male with a h/o HTN, and DM presents to the ED with c/o right upper dental pain onset today. Pt stated he also needs medication refill of Lisinopril, and Metformin. Pt noted he has not seen a dentist in over a year. Pt denied n/v/d, CP, SOB, or cough. All other sx denied. No other complaints at this time.         PCP-None      Patient is a 55 y.o. male presenting with dental problem and medication refill. The history is provided by the patient.   Dental Pain        Medication Refill   Pertinent negatives include no chest pain, no abdominal pain, no headaches and no shortness of breath.        Past Medical History:   Diagnosis Date   ??? Diabetes (HCC)    ??? Hypertension    ??? Thromboembolus [facility])        Past Surgical History:   Procedure Laterality Date   ??? ABDOMEN SURGERY PROC UNLISTED           History reviewed. No pertinent family history.    Social History     Social History   ??? Marital status: DIVORCED     Spouse name: N/A   ??? Number of children: N/A   ??? Years of education: N/A     Occupational History   ??? Not on file.     Social History Main Topics   ??? Smoking status: Never Smoker   ??? Smokeless tobacco: Not on file   ??? Alcohol use No   ??? Drug use: No   ??? Sexual activity: Not on file     Other Topics Concern   ??? Not on file     Social History Narrative         ALLERGIES: Review of patient's allergies indicates no known allergies.    Review of Systems   Constitutional: Negative for fever.   HENT: Positive for dental problem. Negative for sore throat.    Eyes: Negative for redness and visual disturbance.   Respiratory: Negative for shortness of breath and wheezing.    Cardiovascular: Negative for chest pain.   Gastrointestinal: Negative for abdominal pain, diarrhea, nausea and vomiting.   Endocrine: Negative for polyuria.   Genitourinary: Negative for dysuria.   Musculoskeletal: Negative for arthralgias and neck stiffness.   Skin: Negative for rash.    Neurological: Negative for headaches.   All other systems reviewed and are negative.      Vitals:    10/17/15 1454   BP: (!) 156/96   Pulse: 69   Resp: 16   Temp: 97.6 ??F (36.4 ??C)   SpO2: 99%   Weight: (!) 163.3 kg (360 lb)   Height: 6' 2.5" (1.892 m)            Physical Exam   Constitutional: He appears well-developed and well-nourished. No distress.   HENT:   Head: Normocephalic and atraumatic.   Right upper 1st and 3rd molar rotten to the gum line - no obviosu swelling or erythema   Pulmonary/Chest: Effort normal. No stridor. No respiratory distress.   Neurological: He is alert.   Skin: He is not diaphoretic.   Psychiatric: He has a normal mood and affect.   Nursing note and vitals reviewed.       MDM  Number of Diagnoses or Management Options  Caries:   Dentalgia:  Medication refill:   Diagnosis management comments: Dental caries - needs referral also refill prescriptions    ED Course       Procedures      Vitals:  Patient Vitals for the past 12 hrs:   Temp Pulse Resp BP SpO2   10/17/15 1454 97.6 ??F (36.4 ??C) 69 16 (!) 156/96 99 %         Medications ordered:   Medications - No data to display      Lab findings:  No results found for this or any previous visit (from the past 12 hour(s)).    EKG interpretation by ED Physician:      X-Ray, CT or other radiology findings or impressions:  No orders to display       Progress notes, Consult notes or additional Procedure notes:       Reevaluation of patient:       Disposition:  Diagnosis:   1. Dentalgia    2. Caries        Disposition: discharge    Follow-up Information     Follow up With Details Comments Contact Info    [facility] Go in 1 day For follow up [address]  [place] [phone]  [phone]    [facility] EMERGENCY DEPT Go to As needed, If symptoms worsen [address]  [place] [id number]  [phone]            Patient's Medications   Start Taking    No medications on file   Continue Taking     LISINOPRIL (PRINIVIL, ZESTRIL) 20 MG TABLET    Take 20 mg by mouth two (2) times a day.    METFORMIN (GLUCOPHAGE) 500 MG TABLET    Take 1,000 mg by mouth two (2) times daily (with meals).    WARFARIN (COUMADIN) 10 MG TABLET    Take 10 mg by mouth daily.   These Medications have changed    No medications on file   Stop Taking    AMLODIPINE (NORVASC) 5 MG TABLET    Take 1 Tab by mouth daily.    BUTALBITAL-ACETAMINOPHEN-CAFFEINE (ESGIC PLUS) 50-500-40 MG PER TABLET    Take 2 Tabs by mouth every six (6) hours as needed for Headache.    INSULIN NPH/INSULIN REGULAR (NOVOLIN 70/30) 100 UNIT/ML (70-30) INJECTION    50 Units by SubCUTAneous route.    LISINOPRIL-HYDROCHLOROTHIAZIDE (PRINZIDE, ZESTORETIC) 20-12.5 MG PER TABLET    Take  by mouth daily.    METHOCARBAMOL (ROBAXIN) 500 MG TABLET    Take 1 Tab by mouth four (4) times daily.    SIMVASTATIN (ZOCOR) 20 MG TABLET    Take 20 mg by mouth nightly.     Scribe Attestation  David SwazilandJordan scribing for and in the presence of Kirke ShaggyMichael L Kymberlee Viger, MD 3:00 PM, 10/17/15.    Physician Attestation  I personally performed the services described in the documentation, reviewed the documentation, as recorded by the scribe in my presence, and it accurately and completely records my words and actions.    Kirke ShaggyMichael L Cledis Sohn, MD 3:00 PM 10/17/15        Signed by : David SwazilandJordan, Scribe, 10/17/15 at 3:00 PM

## 2021-09-09 ENCOUNTER — Encounter: Payer: Self-pay | Admitting: *Deleted

## 2021-09-09 ENCOUNTER — Other Ambulatory Visit: Payer: Self-pay

## 2021-09-09 DIAGNOSIS — K0889 Other specified disorders of teeth and supporting structures: Secondary | ICD-10-CM | POA: Insufficient documentation

## 2021-09-09 NOTE — ED Triage Notes (Signed)
Pt has right lower tooth pain.  Pt has swelling to right side of face.  Pt saw dentist yesterday, referred to oral surgeon.  No resp distress  taking otc meds without relief.  Pt alert.  ?

## 2021-09-10 ENCOUNTER — Emergency Department
Admission: EM | Admit: 2021-09-10 | Discharge: 2021-09-10 | Disposition: A | Discharge status: Home / Self Care | Payer: BC Managed Care – PPO | Attending: Emergency Medicine | Admitting: Emergency Medicine

## 2021-09-10 DIAGNOSIS — K0889 Other specified disorders of teeth and supporting structures: Secondary | ICD-10-CM

## 2021-09-10 MED ORDER — MAGIC MOUTHWASH W/LIDOCAINE: 5.0000 mL | Freq: Four times a day (QID) | ORAL | 0 refills | Status: DC | PRN

## 2021-09-10 MED ORDER — HYDROCODONE-ACETAMINOPHEN 5-325 MG PO TABS: 2.0000 | ORAL_TABLET | Freq: Four times a day (QID) | ORAL | 0 refills | Status: DC | PRN

## 2021-09-10 NOTE — ED Notes (Signed)
Pt to ED for Dental pain that started a couple days ago, pt went to dentist states they couldn't find anything, referred to oral surgeon, pt unable to go to oral surgeon until a month. Slight swelling to R jaw area. Pt states it feels like " a tazer shooting in the jaw". Denies recent oral surgery, denies fevers  ?

## 2021-09-10 NOTE — Discharge Instructions (Addendum)
As we discussed, there is no clear source of the pain you are experiencing.  You can wait and follow-up with the oral surgeon as planned next month, but we encourage you to follow-up at the next available opportunity at the [facility].  They have all the possible specialist available to them, and should be able to see you sooner than your local provider. ? ?Try using the prescribed "Magic mouthwash" for pain relief when it happens.  Use over-the-counter ibuprofen and/or Tylenol as needed for pain relief.  If you are having severe pain that is persisting, try using the stronger pain medication prescribed today, but please understand that you will not receive any additional opioid medications from the emergency department; pain like this needs to be managed as an outpatient in clinic. ? ?If you develop any new or worsening symptoms that concern you, such as fever, swelling, difficulty swallowing, etc., please return to the nearest emergency department. ?

## 2021-09-10 NOTE — ED Provider Notes (Signed)
? ?[facility] ?Provider Note ? ? ? Event Date/Time  ? First MD Initiated Contact with Patient 09/10/21 0157   ?  (approximate) ? ? ?History  ? ?Dental Pain ? ? ?HPI ? ?Edwin Edwards is a 61 y.o. male who presents for evaluation of intermittent sharp pain in the right lower part of his jaw.  This has been going on for a few days now.  He has had no recent injury.  He is not having any difficulty speaking or swallowing and he has had no swelling to the area. ? ?He reports that he went to the dentist within the last couple of days and they did x-rays and could not tell him a particular reason that he was having pain.  They did not prescribe him any medication including no antibiotics.  They encouraged him to follow-up with an oral surgeon.  He called the oral surgeon and scheduled an appointment but it will not be for another month. ? ?He said that the pain does not happen all the time.  He does not know when it is going to occur, but when it hits him, it hits very hard and severe but only lasts for a few seconds.  It is sharp, not burning, does not radiate and stays located in the right middle part of the lower jaw. ? ?No recent fever, no shortness of breath. ?  ? ? ?Physical Exam  ? ?Triage Vital Signs: ?ED Triage Vitals  ?Enc Vitals Group  ?   BP 09/09/21 2240 (!) 170/99  ?   Pulse Rate 09/09/21 2240 81  ?   Resp 09/09/21 2240 18  ?   Temp 09/09/21 2240 98.3 ?F (36.8 ?C)  ?   Temp Source 09/09/21 2240 Oral  ?   SpO2 09/09/21 2240 95 %  ?   Weight 09/09/21 2241 (!) 140.6 kg (310 lb)  ?   Height 09/09/21 2241 1.905 m (6\' 3" )  ?   Head Circumference --   ?   Peak Flow --   ?   Pain Score 09/09/21 2241 10  ?   Pain Loc --   ?   Pain Edu? --   ?   Excl. in GC? --   ? ? ?Most recent vital signs: ?Vitals:  ? 09/09/21 2240  ?BP: (!) 170/99  ?Pulse: 81  ?Resp: 18  ?Temp: 98.3 ?F (36.8 ?C)  ?SpO2: 95%  ? ? ? ?General: Awake, no distress.  ?HEENT: No swelling, no erythema, no obvious gingivitis or  acute dental caries.  No evidence of dental fracture.  No mandibular tenderness to palpation or swelling.  No tenderness to palpation of the maxilla or along the trigeminal nerve distribution.  No angioedema or evidence of cellulitis. ?CV:  Good peripheral perfusion.  ?Resp:  Normal effort.  ?Abd:  No distention.  ? ? ?ED Results / Procedures / Treatments  ? ?Labs ?(all labs ordered are listed, but only abnormal results are displayed) ?Labs Reviewed - No data to display ? ? ?PROCEDURES: ? ?Critical Care performed: No ? ?Procedures ? ? ?MEDICATIONS ORDERED IN ED: ?Medications - No data to display ? ? ?IMPRESSION / MDM / ASSESSMENT AND PLAN / ED COURSE  ?I reviewed the triage vital signs and the nursing notes. ?             ?               ? ?Differential diagnosis includes, but is not limited to,  nonspecific dental pain, dental caries, dental fracture, dental infection, gingivitis, mandibular fracture or dislocation, trigeminal neuralgia or other nonspecific facial pain. ? ?Vital signs are stable and within normal limits other than some hypertension.  Patient's physical exam is reassuring with no clear indication for why he would be having intermittent sharp pain that completely resolves and only last for a few seconds. ? ?There is no indication that he has an acute or emergent medical condition that requires further evaluation or treatment in the emergency department.  He just saw a dentist and had x-rays performed and no specific treatment was ordered nor medications prescribed.  I see no evidence that he would benefit from antibiotics. ? ?I had a frank and honest conversation with the patient and suggested he may want to go to the [facility] school of dentistry where more specialists are available.  They might be able to see him sooner than waiting for a month to see a local oral surgeon.  I verified in the [place] controlled substance database that he has no history of frequent controlled substance prescriptions.   I gave him a prescription for 8 Norco to have his backup and a prescription for Magic mouthwash with lidocaine which may help if he is having persistent symptoms, but explained that the emergency department will not be able to treat future pain.  However I cautioned him that should he develop any new or worsening symptoms to come back immediately to the emergency department.  He says that he understands and agrees with the plan. ? ? ? ? ?  ? ? ?FINAL CLINICAL IMPRESSION(S) / ED DIAGNOSES  ? ?Final diagnoses:  ?Pain, dental  ? ? ? ?Rx / DC Orders  ? ?ED Discharge Orders   ? ?      Ordered  ?  magic mouthwash w/lidocaine SOLN  4 times daily PRN       ?Note to Pharmacy: Please mix viscous lidocaine 2% with magic mouthwash solution so that the lidocaine comprises approximately 25 % of the total solution.  ? 09/10/21 0320  ?  HYDROcodone-acetaminophen (NORCO/VICODIN) 5-325 MG tablet  Every 6 hours PRN       ? 09/10/21 0320  ? ?  ?  ? ?  ? ? ? ?Note:  This document was prepared using Dragon voice recognition software and may include unintentional dictation errors. ?  ?Loleta Rose, MD ?09/10/21 [phone] ? ?

## 2021-10-10 ENCOUNTER — Emergency Department
Admission: EM | Admit: 2021-10-10 | Discharge: 2021-10-10 | Disposition: A | Discharge status: Home / Self Care | Payer: BC Managed Care – PPO | Attending: Emergency Medicine | Admitting: Emergency Medicine

## 2021-10-10 ENCOUNTER — Encounter: Payer: Self-pay | Admitting: Emergency Medicine

## 2021-10-10 ENCOUNTER — Emergency Department: Payer: BC Managed Care – PPO

## 2021-10-10 ENCOUNTER — Other Ambulatory Visit: Payer: Self-pay

## 2021-10-10 DIAGNOSIS — G500 Trigeminal neuralgia: Secondary | ICD-10-CM | POA: Insufficient documentation

## 2021-10-10 DIAGNOSIS — E119 Type 2 diabetes mellitus without complications: Secondary | ICD-10-CM | POA: Diagnosis not present

## 2021-10-10 DIAGNOSIS — R0602 Shortness of breath: Secondary | ICD-10-CM | POA: Diagnosis not present

## 2021-10-10 DIAGNOSIS — R6884 Jaw pain: Secondary | ICD-10-CM | POA: Diagnosis present

## 2021-10-10 DIAGNOSIS — I10 Essential (primary) hypertension: Secondary | ICD-10-CM | POA: Diagnosis not present

## 2021-10-10 HISTORY — DX: Type 2 diabetes mellitus without complications: E11.9

## 2021-10-10 HISTORY — DX: Essential (primary) hypertension: I10

## 2021-10-10 LAB — CBC WITH DIFFERENTIAL/PLATELET
Abs Immature Granulocytes: 0.01 10*3/uL (ref 0.00–0.07)
Basophils Absolute: 0 10*3/uL (ref 0.0–0.1)
Basophils Relative: 0 %
Eosinophils Absolute: 0 10*3/uL (ref 0.0–0.5)
Eosinophils Relative: 1 %
HCT: 47.2 % (ref 39.0–52.0)
Hemoglobin: 14.5 g/dL (ref 13.0–17.0)
Immature Granulocytes: 0 %
Lymphocytes Relative: 31 %
Lymphs Abs: 0.9 10*3/uL (ref 0.7–4.0)
MCH: 24.5 pg — ABNORMAL LOW (ref 26.0–34.0)
MCHC: 30.7 g/dL (ref 30.0–36.0)
MCV: 79.6 fL — ABNORMAL LOW (ref 80.0–100.0)
Monocytes Absolute: 0.5 10*3/uL (ref 0.1–1.0)
Monocytes Relative: 16 %
Neutro Abs: 1.5 10*3/uL — ABNORMAL LOW (ref 1.7–7.7)
Neutrophils Relative %: 52 %
Platelets: 209 10*3/uL (ref 150–400)
RBC: 5.93 MIL/uL — ABNORMAL HIGH (ref 4.22–5.81)
RDW: 14.1 % (ref 11.5–15.5)
WBC: 2.9 10*3/uL — ABNORMAL LOW (ref 4.0–10.5)
nRBC: 0 % (ref 0.0–0.2)

## 2021-10-10 LAB — BASIC METABOLIC PANEL
Anion gap: 6 (ref 5–15)
BUN: 14 mg/dL (ref 6–20)
CO2: 24 mmol/L (ref 22–32)
Calcium: 9.1 mg/dL (ref 8.9–10.3)
Chloride: 109 mmol/L (ref 98–111)
Creatinine, Ser: 0.93 mg/dL (ref 0.61–1.24)
GFR, Estimated: >60 mL/min (ref >60)
Glucose, Bld: 136 mg/dL — ABNORMAL HIGH (ref 70–99)
Potassium: 4 mmol/L (ref 3.5–5.1)
Sodium: 139 mmol/L (ref 135–145)

## 2021-10-10 MED ORDER — IBUPROFEN 400 MG PO TABS
400.0000 mg | ORAL_TABLET | Freq: Once | ORAL | Status: AC
  Administered 2021-10-10: 400 mg via ORAL
  Filled 2021-10-10: qty 1

## 2021-10-10 MED ORDER — OXYCODONE-ACETAMINOPHEN 5-325 MG PO TABS: 1.0000 | ORAL_TABLET | ORAL | 0 refills | Status: AC | PRN

## 2021-10-10 MED ORDER — ACETAMINOPHEN 500 MG PO TABS
1000.0000 mg | ORAL_TABLET | Freq: Once | ORAL | Status: AC
  Administered 2021-10-10: 1000 mg via ORAL
  Filled 2021-10-10: qty 2

## 2021-10-10 NOTE — ED Provider Notes (Signed)
[facility] Provider Note    Event Date/Time   First MD Initiated Contact with Patient 10/10/21 [phone]     (approximate)   History   Headache and Shortness of Breath   HPI  Edwin Edwards is a 61 y.o. male past medical history of diabetes and hypertension presents with/jaw pain.  Symptoms of been going on for about 1 month.  He endorses a shooting pain down the right side of the jaw that also goes up to the right head.  It comes and goes last for seconds at a time.  Comes on randomly.  Was initially occurring about 6 times a day but is now coming back to back.  Does endorse some intermittent blurred vision and floaters in the right eye but denies this currently.  Denies eye pain.  He feels like his gums have pain but denies any pain chewing.  Denies jaw claudication.  Denies fevers chills shoulder or hip pain.  No myalgias.  Denies numbness tingling weakness.  Also felt somewhat short of breath when he woke up today but this is improved.  Denies chest pain has some lower extremity edema which is new.  He recently relocated from [place] does not have a primary care physician yet.  He was seen in the ED on 5/3 with similar pain referred to dentistry.  This and has had x-rays done but was not told anything specific.   Past Medical History:  Diagnosis Date   Diabetes mellitus without complication (HCC)    Hypertension     There are no problems to display for this patient.    Physical Exam  Triage Vital Signs: ED Triage Vitals  Enc Vitals Group     BP 10/10/21 0945 (!) 175/104     Pulse Rate 10/10/21 0945 91     Resp 10/10/21 0945 16     Temp 10/10/21 0948 98.2 F (36.8 C)     Temp src --      SpO2 10/10/21 0945 95 %     Weight 10/10/21 0946 (!) 320 lb (145.2 kg)     Height 10/10/21 0946 6\' 3"  (1.905 m)     Head Circumference --      Peak Flow --      Pain Score 10/10/21 0945 10     Pain Loc --      Pain Edu? --      Excl. in GC? --     Most recent  vital signs: Vitals:   10/10/21 1006 10/10/21 1009  BP:    Pulse:    Resp: 17   Temp:    SpO2:  97%     General: Awake, no distress.  CV:  Good peripheral perfusion.  Resp:  Normal effort.  Abd:  No distention.  Neuro:             Awake, Alert, Oriented x 3  Other:  Aox3, nml speech  PERRL, EOMI, face symmetric, nml tongue movement  5/5 strength in the BL upper and lower extremities  Sensation grossly intact in the BL upper and lower extremities  Finger-nose-finger intact BL  No obvious facial tenderness, jaw tenderness or temporal tenderness He is missing multiple molars on the right side no intraoral tenderness or swelling   ED Results / Procedures / Treatments  Labs (all labs ordered are listed, but only abnormal results are displayed) Labs Reviewed  CBC WITH DIFFERENTIAL/PLATELET - Abnormal; Notable for the following components:      Result  Value   WBC 2.9 (*)    RBC 5.93 (*)    MCV 79.6 (*)    MCH 24.5 (*)    Neutro Abs 1.5 (*)    All other components within normal limits  BASIC METABOLIC PANEL - Abnormal; Notable for the following components:   Glucose, Bld 136 (*)    All other components within normal limits     EKG  EKG interpretation performed by myself: NSR, nml axis, nml intervals, no acute ischemic changes    RADIOLOGY I reviewed and interpreted the CXR which does not show any acute cardiopulmonary process    PROCEDURES:  Critical Care performed: No  Procedures  The patient is on the cardiac monitor to evaluate for evidence of arrhythmia and/or significant heart rate changes.   MEDICATIONS ORDERED IN ED: Medications  ibuprofen (ADVIL) tablet 400 mg (400 mg Oral Given 10/10/21 1057)  acetaminophen (TYLENOL) tablet 1,000 mg (1,000 mg Oral Given 10/10/21 1057)     IMPRESSION / MDM / ASSESSMENT AND PLAN / ED COURSE  I reviewed the triage vital signs and the nursing notes.                              Patient's presentation is most  consistent with acute complicated illness / injury requiring diagnostic workup.  Differential diagnosis includes, but is not limited to, trigeminal neuralgia, dental pain, cluster headache, migraine headache, temporal arteritis  Patient is a 61 year old male who presents with jaw pain/headache.  Describes it as a shocklike pain comes and goes last for seconds at a time.  Does have associated headache with it as well as some intermittent blurred vision.  No other neurologic symptoms.  Was seen in the ED about a month ago for similar and was referred to [facility] dentistry but has not been able to get seen yet.  Taking Aleve for it.  His exam is essentially normal he has no temporal tenderness does have some mild pain with range of the right jaw there is nothing obvious intraorally to explain the pain is neurologic exam is nonfocal.  Suspect trigeminal neuralgia based on his symptomatology.  I considered temporal arteritis but the intermittent shocklike pain is less typical for this and he has no jaw claudication or temporal tenderness.  Ultimately suspect that is less likely.  Did obtain a CT given her headache to rule out intracranial mass or hemorrhage.  This was negative.  Patient also with secondary complaint of shortness of breath that was short-lived this morning and he says he would not have come to the ED for primarily if it did not been for his facial pain.  His chest x-ray is clear sats are fine does have some lower extremity edema and is hypertensive question whether he has some new heart failure.  CBC and BMP are reassuring he has no chest pain EKG is nonischemic.  CT head is negative.  I do suspect that this is likely to be trigeminal neuralgia.  However do not feel comfortable starting carbamazepine in the ED as patient will need follow-up of his CBC and because he does not yet have a primary care provider I do not feel confident that he will have adequate follow-up to be started on this medication.  We  will refer him to neurology.     FINAL CLINICAL IMPRESSION(S) / ED DIAGNOSES   Final diagnoses:  Trigeminal neuralgia     Rx / DC Orders  ED Discharge Orders          Ordered    oxyCODONE-acetaminophen (PERCOCET) 5-325 MG tablet  Every 4 hours PRN        10/10/21 1107             Note:  This document was prepared using Dragon voice recognition software and may include unintentional dictation errors.   Georga HackingMcHugh, Rudell Ortman Rose, MD 10/10/21 [phone]

## 2021-10-10 NOTE — ED Triage Notes (Signed)
Pt to ED via POV c/o headache, blurred vision in the right eye, bilateral LE edema, and shortness of breath. Pt states that he has hx/o HTN in the past, was on medication but it was stopped about 2 years ago. Pt denies hx/o CHF but has significant LE edema and shortness of breath. Pt states that the pain in in his head started in his right jaw and radiates up his face. Pt is hypertensive in triage.

## 2021-10-10 NOTE — ED Notes (Signed)
Went to assess patient and place on monitor. Pt not in room at this time.

## 2021-10-10 NOTE — Discharge Instructions (Signed)
I suspect that you have trigeminal neuralgia which is due to inflammation of one of the nerves in your face.  You need to take the Aleve.  Please follow-up with neurology as they may want to start you on a specific medication for this or get an MRI of your brain as an outpatient.  Your blood pressure was elevated.  Please follow-up with primary care for further monitoring of this.

## 2022-01-01 ENCOUNTER — Emergency Department: Payer: BC Managed Care – PPO

## 2022-01-01 ENCOUNTER — Emergency Department
Admission: EM | Admit: 2022-01-01 | Discharge: 2022-01-01 | Disposition: A | Discharge status: Home / Self Care | Payer: BC Managed Care – PPO | Source: Home / Self Care | Attending: Emergency Medicine | Admitting: Emergency Medicine

## 2022-01-01 ENCOUNTER — Other Ambulatory Visit: Payer: Self-pay

## 2022-01-01 DIAGNOSIS — E119 Type 2 diabetes mellitus without complications: Secondary | ICD-10-CM | POA: Insufficient documentation

## 2022-01-01 DIAGNOSIS — Z20822 Contact with and (suspected) exposure to covid-19: Secondary | ICD-10-CM | POA: Insufficient documentation

## 2022-01-01 DIAGNOSIS — M7989 Other specified soft tissue disorders: Secondary | ICD-10-CM | POA: Insufficient documentation

## 2022-01-01 DIAGNOSIS — R600 Localized edema: Secondary | ICD-10-CM | POA: Insufficient documentation

## 2022-01-01 DIAGNOSIS — I110 Hypertensive heart disease with heart failure: Secondary | ICD-10-CM | POA: Diagnosis not present

## 2022-01-01 DIAGNOSIS — I509 Heart failure, unspecified: Secondary | ICD-10-CM | POA: Insufficient documentation

## 2022-01-01 DIAGNOSIS — R0602 Shortness of breath: Secondary | ICD-10-CM | POA: Diagnosis not present

## 2022-01-01 LAB — CBC WITH DIFFERENTIAL/PLATELET
Abs Immature Granulocytes: 0.01 10*3/uL (ref 0.00–0.07)
Basophils Absolute: 0 10*3/uL (ref 0.0–0.1)
Basophils Relative: 0 %
Eosinophils Absolute: 0 10*3/uL (ref 0.0–0.5)
Eosinophils Relative: 1 %
HCT: 42.2 % (ref 39.0–52.0)
Hemoglobin: 13.1 g/dL (ref 13.0–17.0)
Immature Granulocytes: 0 %
Lymphocytes Relative: 25 %
Lymphs Abs: 1.1 10*3/uL (ref 0.7–4.0)
MCH: 24.6 pg — ABNORMAL LOW (ref 26.0–34.0)
MCHC: 31 g/dL (ref 30.0–36.0)
MCV: 79.3 fL — ABNORMAL LOW (ref 80.0–100.0)
Monocytes Absolute: 0.5 10*3/uL (ref 0.1–1.0)
Monocytes Relative: 12 %
Neutro Abs: 2.6 10*3/uL (ref 1.7–7.7)
Neutrophils Relative %: 62 %
Platelets: 206 10*3/uL (ref 150–400)
RBC: 5.32 MIL/uL (ref 4.22–5.81)
RDW: 14.5 % (ref 11.5–15.5)
WBC: 4.2 10*3/uL (ref 4.0–10.5)
nRBC: 0 % (ref 0.0–0.2)

## 2022-01-01 LAB — BASIC METABOLIC PANEL
Anion gap: 10 (ref 5–15)
BUN: 12 mg/dL (ref 6–20)
CO2: 23 mmol/L (ref 22–32)
Calcium: 9 mg/dL (ref 8.9–10.3)
Chloride: 108 mmol/L (ref 98–111)
Creatinine, Ser: 1.06 mg/dL (ref 0.61–1.24)
GFR, Estimated: >60 mL/min (ref >60)
Glucose, Bld: 118 mg/dL — ABNORMAL HIGH (ref 70–99)
Potassium: 4 mmol/L (ref 3.5–5.1)
Sodium: 141 mmol/L (ref 135–145)

## 2022-01-01 LAB — RESP PANEL BY RT-PCR (FLU A&B, COVID) ARPGX2
Influenza A by PCR: NEGATIVE
Influenza B by PCR: NEGATIVE
SARS Coronavirus 2 by RT PCR: NEGATIVE

## 2022-01-01 LAB — BRAIN NATRIURETIC PEPTIDE: B Natriuretic Peptide: 145.4 pg/mL — ABNORMAL HIGH (ref 0.0–100.0)

## 2022-01-01 LAB — TROPONIN I (HIGH SENSITIVITY)
Troponin I (High Sensitivity): 39 ng/L — ABNORMAL HIGH (ref <18)
Troponin I (High Sensitivity): 31 ng/L — ABNORMAL HIGH (ref <18)

## 2022-01-01 LAB — D-DIMER, QUANTITATIVE: D-Dimer, Quant: 1.58 ug/mL-FEU — ABNORMAL HIGH (ref 0.00–0.50)

## 2022-01-01 MED ORDER — FUROSEMIDE 10 MG/ML IJ SOLN
60.0000 mg | Freq: Once | INTRAMUSCULAR | Status: AC
  Administered 2022-01-01: 60 mg via INTRAVENOUS
  Filled 2022-01-01: qty 8

## 2022-01-01 MED ORDER — IOHEXOL 350 MG/ML SOLN
100.0000 mL | Freq: Once | INTRAVENOUS | Status: AC | PRN
  Administered 2022-01-01: 100 mL via INTRAVENOUS

## 2022-01-01 MED ORDER — FUROSEMIDE 20 MG PO TABS: 20.0000 mg | ORAL_TABLET | Freq: Every day | ORAL | 0 refills | Status: DC

## 2022-01-01 NOTE — ED Provider Triage Note (Signed)
Emergency Medicine Provider Triage Evaluation Note  Edwin Edwards , a 61 y.o. male  was evaluated in triage.  Pt complains of leg swelling and SOB x3 days. Reports 10 lb weight gain over 10 days. Has to use 2-3 pillows to prop himself up so he can breathe. No CP. Reports history of DVT.  Also reports dyspnea on exertion  Review of Systems  Positive: SOB/leg swelling, DOE Negative: Back pain  Physical Exam  There were no vitals taken for this visit. Gen:   Awake, no distress   Resp:  Normal effort  MSK:   Moves extremities without difficulty  Other:    Medical Decision Making  Medically screening exam initiated at 1:03 PM.  Appropriate orders placed.  Edwin Edwards was informed that the remainder of the evaluation will be completed by another provider, this initial triage assessment does not replace that evaluation, and the importance of remaining in the ED until their evaluation is complete.     Jackelyn Hoehn, PA-C 01/01/22 1306

## 2022-01-01 NOTE — ED Triage Notes (Signed)
Pt comes with c/o sob and some pain when taking deep breath for several days. Pt states swelling to legs and weight gain of about 10lbs. Pt states more sob when laying down and ambulating.

## 2022-01-01 NOTE — ED Notes (Signed)
Pt. To CT

## 2022-01-01 NOTE — ED Provider Notes (Signed)
[facility] Provider Note    Event Date/Time   First MD Initiated Contact with Patient 01/01/22 1722     (approximate)   History   Chest Pain and Shortness of Breath   HPI  Edwin Edwards is a 61 y.o. male who presents to the emergency department today because of concerns for shortness of breath.  Shortness of breath has been present over the past few days.  He notices it most when he exerts himself but also has some at rest.  No associated chest pain.  No associated cough.  He has had bilateral leg swelling that is also been present over the past few days.  Denies history of swelling or shortness of breath in the past.  Does have history of diabetes and is on medication for that.      Physical Exam   Triage Vital Signs: ED Triage Vitals  Enc Vitals Group     BP 01/01/22 1306 (!) 142/113     Pulse Rate 01/01/22 1306 88     Resp 01/01/22 1306 19     Temp 01/01/22 1306 98 F (36.7 C)     Temp src --      SpO2 01/01/22 1306 98 %     Weight --      Height --      Head Circumference --      Peak Flow --      Pain Score 01/01/22 1312 10     Pain Loc --      Pain Edu? --      Excl. in GC? --     Most recent vital signs: Vitals:   01/01/22 1306 01/01/22 1635  BP: (!) 142/113 (!) 168/99  Pulse: 88 89  Resp: 19 18  Temp: 98 F (36.7 C) 98 F (36.7 C)  SpO2: 98% 98%    General: Awake, alert, oriented. CV:  Good peripheral perfusion. Regular rate and rhythm. Resp:  Normal effort. Lungs clear. Abd:  No distention.  Other:  Bilateral pitting edema of the lower extremities.    ED Results / Procedures / Treatments   Labs (all labs ordered are listed, but only abnormal results are displayed) Labs Reviewed  BRAIN NATRIURETIC PEPTIDE - Abnormal; Notable for the following components:      Result Value   B Natriuretic Peptide 145.4 (*)    All other components within normal limits  D-DIMER, QUANTITATIVE - Abnormal; Notable for the following  components:   D-Dimer, Quant 1.58 (*)    All other components within normal limits  CBC WITH DIFFERENTIAL/PLATELET - Abnormal; Notable for the following components:   MCV 79.3 (*)    MCH 24.6 (*)    All other components within normal limits  BASIC METABOLIC PANEL - Abnormal; Notable for the following components:   Glucose, Bld 118 (*)    All other components within normal limits  TROPONIN I (HIGH SENSITIVITY) - Abnormal; Notable for the following components:   Troponin I (High Sensitivity) 39 (*)    All other components within normal limits  RESP PANEL BY RT-PCR (FLU A&B, COVID) ARPGX2  TROPONIN I (HIGH SENSITIVITY)     EKG  I, Phineas Semen, attending physician, personally viewed and interpreted this EKG  EKG Time: 1306 Rate: 92 Rhythm: normal sinus rhythm Axis: normal Intervals: qtc 477 QRS: 477 ST changes: no st elevation Impression: abnormal ekg   RADIOLOGY I independently interpreted and visualized the CXR. My interpretation: No pneumonia.  Radiology interpretation:  IMPRESSION:  Cardiomegaly. Central pulmonary vessels are more prominent.  Increased interstitial markings are seen in the parahilar regions  and lower lung fields. Findings suggest CHF. Small bilateral pleural  effusions are seen.   I independently interpreted and visualized the CT angio PE. My interpretation: No large, central PE Radiology interpretation:  IMPRESSION:  1. No evidence of acute pulmonary embolism or other acute vascular  findings in the chest.  2. Sequela of prior granulomatous disease.  3. Pulmonary interstitial prominence and bilateral pleural effusions  suggesting congestive heart failure.      PROCEDURES:  Critical Care performed: No  Procedures   MEDICATIONS ORDERED IN ED: Medications  iohexol (OMNIPAQUE) 350 MG/ML injection 100 mL (100 mLs Intravenous Contrast Given 01/01/22 1722)     IMPRESSION / MDM / ASSESSMENT AND PLAN / ED COURSE  I reviewed the triage  vital signs and the nursing notes.                              Differential diagnosis includes, but is not limited to, pneumonia, pneumothorax, CHF, PE.  Patient's presentation is most consistent with acute presentation with potential threat to life or bodily function.  Patient presented to the emergency department today because of concerns for shortness of breath.  Patient also with leg swelling.  Chest x-ray was concerning for possible CHF.  D-dimer was elevated so CT was obtained.  This did not show any acute pulmonary embolism.  Given concern for CHF patient was given Lasix.  He did have good output here in the emergency department.  He said that he felt quite improved.  He said he ambulated to the bathroom without any significant shortness of breath.  Did feel comfortable being discharged home at this time.  I think this is reasonable.  I did discuss concern for heart failure with patient.  Will give patient both cardiology and heart failure clinic follow-up information.     FINAL CLINICAL IMPRESSION(S) / ED DIAGNOSES   Final diagnoses:  Congestive heart failure, unspecified HF chronicity, unspecified heart failure type [facility])     Note:  This document was prepared using Dragon voice recognition software and may include unintentional dictation errors.    Phineas Semen, MD 01/01/22 [phone]

## 2022-01-01 NOTE — Discharge Instructions (Addendum)
Please be sure to call both the cardiologist and heart failure clinic tomorrow to set up follow up appointments. Please return for any worsening shortness of breath.

## 2022-01-02 ENCOUNTER — Encounter: Payer: Self-pay | Admitting: Emergency Medicine

## 2022-01-02 ENCOUNTER — Other Ambulatory Visit: Payer: Self-pay

## 2022-01-02 ENCOUNTER — Emergency Department: Payer: BC Managed Care – PPO

## 2022-01-02 ENCOUNTER — Telehealth: Payer: Self-pay | Admitting: Family

## 2022-01-02 DIAGNOSIS — Z794 Long term (current) use of insulin: Secondary | ICD-10-CM

## 2022-01-02 DIAGNOSIS — I639 Cerebral infarction, unspecified: Secondary | ICD-10-CM | POA: Diagnosis present

## 2022-01-02 DIAGNOSIS — I160 Hypertensive urgency: Secondary | ICD-10-CM | POA: Diagnosis present

## 2022-01-02 DIAGNOSIS — I110 Hypertensive heart disease with heart failure: Principal | ICD-10-CM | POA: Diagnosis present

## 2022-01-02 DIAGNOSIS — Z20822 Contact with and (suspected) exposure to covid-19: Secondary | ICD-10-CM | POA: Diagnosis present

## 2022-01-02 DIAGNOSIS — Z833 Family history of diabetes mellitus: Secondary | ICD-10-CM

## 2022-01-02 DIAGNOSIS — I5031 Acute diastolic (congestive) heart failure: Secondary | ICD-10-CM | POA: Diagnosis present

## 2022-01-02 DIAGNOSIS — T383X6A Underdosing of insulin and oral hypoglycemic [antidiabetic] drugs, initial encounter: Secondary | ICD-10-CM | POA: Diagnosis present

## 2022-01-02 DIAGNOSIS — Z6841 Body Mass Index (BMI) 40.0 and over, adult: Secondary | ICD-10-CM

## 2022-01-02 DIAGNOSIS — Z8249 Family history of ischemic heart disease and other diseases of the circulatory system: Secondary | ICD-10-CM

## 2022-01-02 DIAGNOSIS — E119 Type 2 diabetes mellitus without complications: Secondary | ICD-10-CM | POA: Diagnosis present

## 2022-01-02 DIAGNOSIS — Z86718 Personal history of other venous thrombosis and embolism: Secondary | ICD-10-CM

## 2022-01-02 DIAGNOSIS — I248 Other forms of acute ischemic heart disease: Secondary | ICD-10-CM | POA: Diagnosis present

## 2022-01-02 DIAGNOSIS — Z79899 Other long term (current) drug therapy: Secondary | ICD-10-CM

## 2022-01-02 LAB — COMPREHENSIVE METABOLIC PANEL
ALT: 27 U/L (ref 0–44)
AST: 33 U/L (ref 15–41)
Albumin: 3.8 g/dL (ref 3.5–5.0)
Alkaline Phosphatase: 68 U/L (ref 38–126)
Anion gap: 9 (ref 5–15)
BUN: 15 mg/dL (ref 6–20)
CO2: 24 mmol/L (ref 22–32)
Calcium: 9 mg/dL (ref 8.9–10.3)
Chloride: 106 mmol/L (ref 98–111)
Creatinine, Ser: 1.23 mg/dL (ref 0.61–1.24)
GFR, Estimated: >60 mL/min (ref >60)
Glucose, Bld: 220 mg/dL — ABNORMAL HIGH (ref 70–99)
Potassium: 3.7 mmol/L (ref 3.5–5.1)
Sodium: 139 mmol/L (ref 135–145)
Total Bilirubin: 1 mg/dL (ref 0.3–1.2)
Total Protein: 7.5 g/dL (ref 6.5–8.1)

## 2022-01-02 LAB — CBC WITH DIFFERENTIAL/PLATELET
Abs Immature Granulocytes: 0.01 10*3/uL (ref 0.00–0.07)
Basophils Absolute: 0 10*3/uL (ref 0.0–0.1)
Basophils Relative: 0 %
Eosinophils Absolute: 0.1 10*3/uL (ref 0.0–0.5)
Eosinophils Relative: 2 %
HCT: 43.5 % (ref 39.0–52.0)
Hemoglobin: 13.5 g/dL (ref 13.0–17.0)
Immature Granulocytes: 0 %
Lymphocytes Relative: 32 %
Lymphs Abs: 1.2 10*3/uL (ref 0.7–4.0)
MCH: 24.7 pg — ABNORMAL LOW (ref 26.0–34.0)
MCHC: 31 g/dL (ref 30.0–36.0)
MCV: 79.5 fL — ABNORMAL LOW (ref 80.0–100.0)
Monocytes Absolute: 0.5 10*3/uL (ref 0.1–1.0)
Monocytes Relative: 13 %
Neutro Abs: 2 10*3/uL (ref 1.7–7.7)
Neutrophils Relative %: 53 %
Platelets: 209 10*3/uL (ref 150–400)
RBC: 5.47 MIL/uL (ref 4.22–5.81)
RDW: 14.6 % (ref 11.5–15.5)
WBC: 3.8 10*3/uL — ABNORMAL LOW (ref 4.0–10.5)
nRBC: 0 % (ref 0.0–0.2)

## 2022-01-02 LAB — TROPONIN I (HIGH SENSITIVITY): Troponin I (High Sensitivity): 32 ng/L — ABNORMAL HIGH (ref <18)

## 2022-01-02 LAB — BRAIN NATRIURETIC PEPTIDE: B Natriuretic Peptide: 140.7 pg/mL — ABNORMAL HIGH (ref 0.0–100.0)

## 2022-01-02 LAB — CBG MONITORING, ED: Glucose-Capillary: 199 mg/dL — ABNORMAL HIGH (ref 70–99)

## 2022-01-02 NOTE — ED Triage Notes (Signed)
Pt to ED via POV with c/o increasing SOB. Pt has been ongoing x several days. Per triage note from yesterday pt with continued swelling to BLE and weight gain of approx 10lbs. Pt also c/o HA at this time.

## 2022-01-02 NOTE — Telephone Encounter (Signed)
Called and LVM with patient in attempt to get them scheduled for a new patient appointment after receiving an ER referral but was unable to reach them.   Edwin Edwards, NT

## 2022-01-03 ENCOUNTER — Inpatient Hospital Stay: Payer: BC Managed Care – PPO

## 2022-01-03 ENCOUNTER — Inpatient Hospital Stay
Admission: EM | Admit: 2022-01-03 | Discharge: 2022-01-05 | DRG: 291 | Disposition: A | Discharge status: Home / Self Care | Payer: BC Managed Care – PPO | Attending: Internal Medicine | Admitting: Internal Medicine

## 2022-01-03 ENCOUNTER — Encounter: Payer: Self-pay | Admitting: Internal Medicine

## 2022-01-03 DIAGNOSIS — I5A Non-ischemic myocardial injury (non-traumatic): Secondary | ICD-10-CM | POA: Diagnosis present

## 2022-01-03 DIAGNOSIS — R0602 Shortness of breath: Principal | ICD-10-CM

## 2022-01-03 DIAGNOSIS — I509 Heart failure, unspecified: Secondary | ICD-10-CM

## 2022-01-03 DIAGNOSIS — Z794 Long term (current) use of insulin: Secondary | ICD-10-CM | POA: Diagnosis not present

## 2022-01-03 DIAGNOSIS — I110 Hypertensive heart disease with heart failure: Secondary | ICD-10-CM | POA: Diagnosis present

## 2022-01-03 DIAGNOSIS — I5021 Acute systolic (congestive) heart failure: Secondary | ICD-10-CM | POA: Diagnosis not present

## 2022-01-03 DIAGNOSIS — I10 Essential (primary) hypertension: Secondary | ICD-10-CM | POA: Diagnosis present

## 2022-01-03 DIAGNOSIS — I6389 Other cerebral infarction: Secondary | ICD-10-CM | POA: Diagnosis not present

## 2022-01-03 DIAGNOSIS — I160 Hypertensive urgency: Secondary | ICD-10-CM | POA: Diagnosis present

## 2022-01-03 DIAGNOSIS — R079 Chest pain, unspecified: Secondary | ICD-10-CM

## 2022-01-03 DIAGNOSIS — I639 Cerebral infarction, unspecified: Secondary | ICD-10-CM | POA: Diagnosis present

## 2022-01-03 DIAGNOSIS — Z79899 Other long term (current) drug therapy: Secondary | ICD-10-CM | POA: Diagnosis not present

## 2022-01-03 DIAGNOSIS — Z86718 Personal history of other venous thrombosis and embolism: Secondary | ICD-10-CM | POA: Diagnosis not present

## 2022-01-03 DIAGNOSIS — I5031 Acute diastolic (congestive) heart failure: Secondary | ICD-10-CM

## 2022-01-03 DIAGNOSIS — Z8249 Family history of ischemic heart disease and other diseases of the circulatory system: Secondary | ICD-10-CM | POA: Diagnosis not present

## 2022-01-03 DIAGNOSIS — Z6841 Body Mass Index (BMI) 40.0 and over, adult: Secondary | ICD-10-CM | POA: Diagnosis not present

## 2022-01-03 DIAGNOSIS — Z20822 Contact with and (suspected) exposure to covid-19: Secondary | ICD-10-CM | POA: Diagnosis present

## 2022-01-03 DIAGNOSIS — Z833 Family history of diabetes mellitus: Secondary | ICD-10-CM | POA: Diagnosis not present

## 2022-01-03 DIAGNOSIS — I248 Other forms of acute ischemic heart disease: Secondary | ICD-10-CM | POA: Diagnosis present

## 2022-01-03 DIAGNOSIS — T383X6A Underdosing of insulin and oral hypoglycemic [antidiabetic] drugs, initial encounter: Secondary | ICD-10-CM | POA: Diagnosis present

## 2022-01-03 DIAGNOSIS — E119 Type 2 diabetes mellitus without complications: Secondary | ICD-10-CM | POA: Diagnosis present

## 2022-01-03 DIAGNOSIS — E66813 Obesity, class 3: Secondary | ICD-10-CM | POA: Diagnosis present

## 2022-01-03 LAB — LIPID PANEL
Cholesterol: 180 mg/dL (ref 0–200)
HDL: 43 mg/dL (ref >40)
LDL Cholesterol: 116 mg/dL — ABNORMAL HIGH (ref 0–99)
Total CHOL/HDL Ratio: 4.2 RATIO
Triglycerides: 105 mg/dL (ref <150)
VLDL: 21 mg/dL (ref 0–40)

## 2022-01-03 LAB — HEMOGLOBIN A1C
Hgb A1c MFr Bld: 8.2 % — ABNORMAL HIGH (ref 4.8–5.6)
Mean Plasma Glucose: 188.64 mg/dL

## 2022-01-03 LAB — TROPONIN I (HIGH SENSITIVITY)
Troponin I (High Sensitivity): 27 ng/L — ABNORMAL HIGH (ref <18)
Troponin I (High Sensitivity): 22 ng/L — ABNORMAL HIGH (ref <18)

## 2022-01-03 LAB — URINE DRUG SCREEN, QUALITATIVE (ARMC ONLY)
Amphetamines, Ur Screen: NOT DETECTED
Barbiturates, Ur Screen: NOT DETECTED
Benzodiazepine, Ur Scrn: NOT DETECTED
Cannabinoid 50 Ng, Ur ~~LOC~~: NOT DETECTED
Cocaine Metabolite,Ur ~~LOC~~: NOT DETECTED
MDMA (Ecstasy)Ur Screen: NOT DETECTED
Methadone Scn, Ur: NOT DETECTED
Opiate, Ur Screen: NOT DETECTED
Phencyclidine (PCP) Ur S: NOT DETECTED
Tricyclic, Ur Screen: NOT DETECTED

## 2022-01-03 LAB — CBG MONITORING, ED
Glucose-Capillary: 171 mg/dL — ABNORMAL HIGH (ref 70–99)
Glucose-Capillary: 167 mg/dL — ABNORMAL HIGH (ref 70–99)
Glucose-Capillary: 111 mg/dL — ABNORMAL HIGH (ref 70–99)

## 2022-01-03 MED ORDER — FUROSEMIDE 10 MG/ML IJ SOLN: 40.0000 mg | Freq: Two times a day (BID) | INTRAMUSCULAR | Status: DC

## 2022-01-03 MED ORDER — FUROSEMIDE 10 MG/ML IJ SOLN
20.0000 mg | Freq: Once | INTRAMUSCULAR | Status: AC
  Administered 2022-01-03: 20 mg via INTRAVENOUS
  Filled 2022-01-03: qty 4

## 2022-01-03 MED ORDER — FUROSEMIDE 10 MG/ML IJ SOLN
40.0000 mg | Freq: Two times a day (BID) | INTRAMUSCULAR | Status: DC
  Administered 2022-01-03 – 2022-01-04 (×2): 40 mg via INTRAVENOUS
  Filled 2022-01-03 (×2): qty 4

## 2022-01-03 MED ORDER — HYDROCODONE-ACETAMINOPHEN 5-325 MG PO TABS
2.0000 | ORAL_TABLET | Freq: Four times a day (QID) | ORAL | Status: DC | PRN
  Administered 2022-01-03 – 2022-01-04 (×2): 2 via ORAL
  Filled 2022-01-03 (×2): qty 2

## 2022-01-03 MED ORDER — INSULIN GLARGINE-YFGN 100 UNIT/ML ~~LOC~~ SOLN
20.0000 [IU] | Freq: Two times a day (BID) | SUBCUTANEOUS | Status: DC
  Administered 2022-01-03 – 2022-01-05 (×4): 20 [IU] via SUBCUTANEOUS
  Filled 2022-01-03 (×7): qty 0.2

## 2022-01-03 MED ORDER — DM-GUAIFENESIN ER 30-600 MG PO TB12: 1.0000 | ORAL_TABLET | Freq: Two times a day (BID) | ORAL | Status: DC | PRN

## 2022-01-03 MED ORDER — ALBUTEROL SULFATE (2.5 MG/3ML) 0.083% IN NEBU
3.0000 mL | INHALATION_SOLUTION | RESPIRATORY_TRACT | Status: DC | PRN
  Administered 2022-01-03: 3 mL via RESPIRATORY_TRACT
  Filled 2022-01-03: qty 3

## 2022-01-03 MED ORDER — ENOXAPARIN SODIUM 80 MG/0.8ML IJ SOSY
0.5000 mg/kg | PREFILLED_SYRINGE | INTRAMUSCULAR | Status: DC
  Administered 2022-01-03 – 2022-01-04 (×2): 77.5 mg via SUBCUTANEOUS
  Filled 2022-01-03: qty 0.78
  Filled 2022-01-03: qty 0.8
  Filled 2022-01-03: qty 0.78

## 2022-01-03 MED ORDER — HYDRALAZINE HCL 20 MG/ML IJ SOLN: 5.0000 mg | INTRAMUSCULAR | Status: DC | PRN

## 2022-01-03 MED ORDER — ATORVASTATIN CALCIUM 20 MG PO TABS
40.0000 mg | ORAL_TABLET | Freq: Every day | ORAL | Status: DC
  Administered 2022-01-03 – 2022-01-05 (×3): 40 mg via ORAL
  Filled 2022-01-03 (×3): qty 2

## 2022-01-03 MED ORDER — LOSARTAN POTASSIUM 50 MG PO TABS
25.0000 mg | ORAL_TABLET | Freq: Every day | ORAL | Status: DC
  Administered 2022-01-03 – 2022-01-04 (×2): 25 mg via ORAL
  Filled 2022-01-03 (×2): qty 1

## 2022-01-03 MED ORDER — ONDANSETRON HCL 4 MG/2ML IJ SOLN: 4.0000 mg | Freq: Three times a day (TID) | INTRAMUSCULAR | Status: DC | PRN

## 2022-01-03 MED ORDER — HYDRALAZINE HCL 20 MG/ML IJ SOLN
10.0000 mg | Freq: Once | INTRAMUSCULAR | Status: AC
Start: 2022-01-03 — End: 2022-01-03
  Administered 2022-01-03: 10 mg via INTRAVENOUS
  Filled 2022-01-03: qty 1

## 2022-01-03 MED ORDER — FUROSEMIDE 10 MG/ML IJ SOLN: 80.0000 mg | Freq: Two times a day (BID) | INTRAMUSCULAR | Status: DC

## 2022-01-03 MED ORDER — ACETAMINOPHEN 325 MG PO TABS: 650.0000 mg | ORAL_TABLET | Freq: Four times a day (QID) | ORAL | Status: DC | PRN

## 2022-01-03 MED ORDER — ASPIRIN 81 MG PO CHEW
324.0000 mg | CHEWABLE_TABLET | Freq: Once | ORAL | Status: AC
  Administered 2022-01-03: 324 mg via ORAL
  Filled 2022-01-03: qty 4

## 2022-01-03 MED ORDER — HYDRALAZINE HCL 20 MG/ML IJ SOLN
5.0000 mg | INTRAMUSCULAR | Status: DC | PRN
Start: 2022-01-03 — End: 2022-01-05

## 2022-01-03 MED ORDER — STROKE: EARLY STAGES OF RECOVERY BOOK: Freq: Once | Status: DC

## 2022-01-03 MED ORDER — ASPIRIN 81 MG PO CHEW
81.0000 mg | CHEWABLE_TABLET | Freq: Every day | ORAL | Status: DC
  Administered 2022-01-04 – 2022-01-05 (×2): 81 mg via ORAL
  Filled 2022-01-03 (×2): qty 1

## 2022-01-03 MED ORDER — INSULIN ASPART 100 UNIT/ML IJ SOLN: 0.0000 [IU] | Freq: Every day | INTRAMUSCULAR | Status: DC

## 2022-01-03 MED ORDER — INSULIN ASPART 100 UNIT/ML IJ SOLN
0.0000 [IU] | Freq: Three times a day (TID) | INTRAMUSCULAR | Status: DC
  Administered 2022-01-03 (×2): 2 [IU] via SUBCUTANEOUS
  Administered 2022-01-04: 1 [IU] via SUBCUTANEOUS
  Administered 2022-01-04 – 2022-01-05 (×2): 2 [IU] via SUBCUTANEOUS
  Administered 2022-01-05: 1 [IU] via SUBCUTANEOUS
  Filled 2022-01-03 (×6): qty 1

## 2022-01-03 NOTE — Consult Note (Signed)
Cardiology Consultation   Patient ID: Edwin Edwards MRN: [id number]; DOB: 1960/11/10  Admit date: 01/03/2022 Date of Consult: 01/03/2022  PCP:  Patient, No Pcp Per   [facility] Providers Cardiologist:  [place] rounding   Patient Profile:   Edwin Edwards is a 61 y.o. male with a hx of hypertension, diabetes who is being seen 01/03/2022 for the evaluation of shortness of breath at the request of Dr. Clyde Lundborg.  History of Present Illness:   Edwin Edwards is a 61 year old male with history of hypertension, diabetes presenting with 1 week worsening shortness of breath symptoms.  States becoming acutely short of breath a week ago, also noticed leg edema which progressively got worse.  States having history of hypertension but does not take any medicines for this.  Denies smoking, denies any history of heart disease.  Endorses orthopnea when laying on back, usually sleeps on side.  Denies chest pain.  In the ED upon admission, chest x-ray showed pulmonary edema and pleural effusions, troponins were 32, 27, EKG showed sinus rhythm.  BNP 145.  Systolic BP elevated in the 160s.  Started on IV Lasix with improvement in symptoms.  Net -1.6 L so far.  Also complaint of headache prompting eval with MRI of brain which showed acute nonhemorrhagic infarct in the right internal capsule.   Past Medical History:  Diagnosis Date   Diabetes mellitus without complication (HCC)    Hypertension     Past Surgical History:  Procedure Laterality Date   ABDOMINAL SURGERY     COLON SURGERY       Home Medications:  Prior to Admission medications   Medication Sig Start Date End Date Taking? Authorizing Provider  furosemide (LASIX) 20 MG tablet Take 1 tablet (20 mg total) by mouth daily for 7 days. 01/01/22 01/08/22 Yes Phineas Semen, MD  insulin glargine (LANTUS) 100 UNIT/ML injection Inject 42 Units into the skin 2 (two) times daily. 08/13/21  Yes [provider]   HYDROcodone-acetaminophen (NORCO/VICODIN) 5-325 MG tablet Take 2 tablets by mouth every 6 (six) hours as needed for moderate pain or severe pain. 09/10/21   Loleta Rose, MD  magic mouthwash w/lidocaine SOLN Take 5 mLs by mouth 4 (four) times daily as needed for mouth pain. Swish and spit, do not swallow the solution. Patient not taking: Reported on 01/03/2022 09/10/21   Loleta Rose, MD    Inpatient Medications: Scheduled Meds:  [START ON 01/04/2022]  stroke: early stages of recovery book   Does not apply Once   [START ON 01/04/2022] aspirin  81 mg Oral Daily   atorvastatin  40 mg Oral Daily   enoxaparin (LOVENOX) injection  0.5 mg/kg Subcutaneous Q24H   furosemide  40 mg Intravenous BID   insulin aspart  0-5 Units Subcutaneous QHS   insulin aspart  0-9 Units Subcutaneous TID WC   insulin glargine-yfgn  20 Units Subcutaneous BID   losartan  25 mg Oral Daily   Continuous Infusions:  PRN Meds: acetaminophen, albuterol, dextromethorphan-guaiFENesin, hydrALAZINE, HYDROcodone-acetaminophen, ondansetron (ZOFRAN) IV  Allergies:   No Known Allergies  Social History:   Social History   Socioeconomic History   Marital status: Single    Spouse name: Not on file   Number of children: Not on file   Years of education: Not on file   Highest education level: Not on file  Occupational History   Not on file  Tobacco Use   Smoking status: Never   Smokeless tobacco: Never  Substance and Sexual Activity  Alcohol use: Not Currently   Drug use: Not Currently   Sexual activity: Not on file  Other Topics Concern   Not on file  Social History Narrative   Not on file   Social Determinants of Health   Financial Resource Strain: Not on file  Food Insecurity: Not on file  Transportation Needs: Not on file  Physical Activity: Not on file  Stress: Not on file  Social Connections: Not on file  Intimate Partner Violence: Not on file    Family History:    Family History  Problem Relation Age  of Onset   Hypertension Mother    Diabetes Mother    Heart attack Mother      ROS:  Please see the history of present illness.   All other ROS reviewed and negative.     Physical Exam/Data:   Vitals:   01/03/22 0930 01/03/22 1100 01/03/22 1230 01/03/22 1325  BP: (!) 162/100 (!) 161/89  (!) 142/97  Pulse: 77 90 91 87  Resp: 18 (!) 21 17 13   Temp:    97.6 F (36.4 C)  TempSrc:    Oral  SpO2: 97% 99% 98% 98%  Weight:      Height:        Intake/Output Summary (Last 24 hours) at 01/03/2022 1540 Last data filed at 01/03/2022 1028 Gross per 24 hour  Intake --  Output 1600 ml  Net -1600 ml      01/02/2022    9:26 PM 10/10/2021    9:46 AM 09/09/2021   10:41 PM  Last 3 Weights  Weight (lbs) 340 lb 320 lb 310 lb  Weight (kg) 154.223 kg 145.151 kg 140.615 kg     Body mass index is 42.5 kg/m.  General:  Well nourished, well developed, in no acute distress HEENT: normal Neck: no JVD Vascular: No carotid bruits; Distal pulses 2+ bilaterally Cardiac:  normal S1, S2; RRR; no murmur  Lungs: Expiratory wheezing Abd: soft, nontender, no hepatomegaly  Ext: 2+ pitting edema Musculoskeletal:  No deformities, BUE and BLE strength normal and equal Skin: warm and dry  Neuro:  CNs 2-12 intact, no focal abnormalities noted Psych:  Normal affect   EKG:  The EKG was personally reviewed and demonstrates: Sinus rhythm Telemetry:  Telemetry was personally reviewed and demonstrates: Sinus rhythm  Relevant CV Studies: Echocardiogram ordered, pending  Laboratory Data:  High Sensitivity Troponin:   Recent Labs  Lab 01/01/22 1327 01/01/22 1720 01/02/22 2134 01/03/22 0212 01/03/22 1027  TROPONINIHS 39* 31* 32* 27* 22*     Chemistry Recent Labs  Lab 01/01/22 1327 01/02/22 2134  NA 141 139  K 4.0 3.7  CL 108 106  CO2 23 24  GLUCOSE 118* 220*  BUN 12 15  CREATININE 1.06 1.23  CALCIUM 9.0 9.0  GFRNONAA >60 >60  ANIONGAP 10 9    Recent Labs  Lab 01/02/22 2134  PROT 7.5   ALBUMIN 3.8  AST 33  ALT 27  ALKPHOS 68  BILITOT 1.0   Lipids  Recent Labs  Lab 01/03/22 1027  CHOL 180  TRIG 105  HDL 43  LDLCALC 116*  CHOLHDL 4.2    Hematology Recent Labs  Lab 01/01/22 1327 01/02/22 2134  WBC 4.2 3.8*  RBC 5.32 5.47  HGB 13.1 13.5  HCT 42.2 43.5  MCV 79.3* 79.5*  MCH 24.6* 24.7*  MCHC 31.0 31.0  RDW 14.5 14.6  PLT 206 209   Thyroid No results for input(s): "TSH", "FREET4" in the last  168 hours.  BNP Recent Labs  Lab 01/01/22 1327 01/02/22 2134  BNP 145.4* 140.7*    DDimer  Recent Labs  Lab 01/01/22 1327  DDIMER 1.58*     Radiology/Studies:  MR BRAIN WO CONTRAST  Result Date: 01/03/2022 CLINICAL DATA:  Headache, new or worsening. Progressive shortness of breath. EXAM: MRI HEAD WITHOUT CONTRAST TECHNIQUE: Multiplanar, multiecho pulse sequences of the brain and surrounding structures were obtained without intravenous contrast. COMPARISON:  A 9 mm acute scratched at CT head without contrast 01/02/22 FINDINGS: Brain: Acute nonhemorrhagic infarct is confirmed within the genu of the right internal capsule. Infarct measures 12 mm maximally on coronal images. T2 and FLAIR hyperintensities associated. No acute hemorrhage is scratched at no cc hemorrhage is present. Scattered subcortical T2 hyperintensities bilaterally are mildly advanced for age. No additional acute infarcts are present. The ventricles are of normal size. No significant extraaxial fluid collection is present. The brainstem and cerebellum are within normal limits. Vascular: Flow is present in the major intracranial arteries. Skull and upper cervical spine: The craniocervical junction is normal. Upper cervical spine is within normal limits. Marrow signal is unremarkable. Sinuses/Orbits: The paranasal sinuses and mastoid air cells are clear. The globes and orbits are within normal limits. IMPRESSION: 1. 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 2.  Scattered subcortical T2 hyperintensities bilaterally are mildly advanced for age. The finding is nonspecific but can be seen in the setting of chronic microvascular ischemia, a demyelinating process such as multiple sclerosis, vasculitis, complicated migraine headaches, or as the sequelae of a prior infectious or inflammatory process. These results will be called to the ordering clinician or representative by the Radiologist Assistant, and communication documented in the PACS or [employer]. Electronically Signed   By: Marin Roberts M.D.   On: 01/03/2022 14:48   DG Chest 2 View  Result Date: 01/02/2022 CLINICAL DATA:  Dyspnea EXAM: CHEST - 2 VIEW COMPARISON:  None Available. FINDINGS: The lungs are symmetrically well expanded. Benign calcified granuloma within the right mid lung zone. Lungs are clear. No pneumothorax or pleural effusion. Cardiac size is within normal limits. There is mild central pulmonary vascular engorgement, similar to prior examination, without superimposed overt pulmonary edema. IMPRESSION: Mild central pulmonary vascular engorgement. No overt pulmonary edema. Electronically Signed   By: Helyn Numbers M.D.   On: 01/02/2022 22:21   CT Head Wo Contrast  Result Date: 01/02/2022 CLINICAL DATA:  Dizziness headache EXAM: CT HEAD WITHOUT CONTRAST TECHNIQUE: Contiguous axial images were obtained from the base of the skull through the vertex without intravenous contrast. RADIATION DOSE REDUCTION: This exam was performed according to the departmental dose-optimization program which includes automated exposure control, adjustment of the mA and/or kV according to patient size and/or use of iterative reconstruction technique. COMPARISON:  CT brain 10/10/2021 FINDINGS: Brain: No hemorrhage or intracranial mass. Patchy white matter hypodensities consistent with chronic small vessel ischemic change. Hypodensity within the right internal capsule is new compared to prior and is consistent  with infarct of uncertain age. The ventricles are nonenlarged. Vascular: No hyperdense vessels.  No unexpected calcification Skull: Normal. Negative for fracture or focal lesion. Sinuses/Orbits: No acute finding. Other: None IMPRESSION: 1. Negative for hemorrhage or mass 2. Hypodensity within the right internal capsule is new compared to prior head CT from June and consistent with small infarct of uncertain age, this could be correlated with MRI as indicated. 3. Elsewhere no significant change in chronic small vessel ischemic disease Electronically Signed  By: Jasmine Pang M.D.   On: 01/02/2022 22:00   CT Angio Chest PE W and/or Wo Contrast  Result Date: 01/01/2022 CLINICAL DATA:  Chest pain or shortness of breath, pleurisy or effusion suspected. Shortness of breath for 4 days. EXAM: CT ANGIOGRAPHY CHEST WITH CONTRAST TECHNIQUE: Multidetector CT imaging of the chest was performed using the standard protocol during bolus administration of intravenous contrast. Multiplanar CT image reconstructions and MIPs were obtained to evaluate the vascular anatomy. RADIATION DOSE REDUCTION: This exam was performed according to the departmental dose-optimization program which includes automated exposure control, adjustment of the mA and/or kV according to patient size and/or use of iterative reconstruction technique. CONTRAST:  OMNIPAQUE IOHEXOL 350 MG/ML SOLN COMPARISON:  Chest radiographs today and 10/10/2021. FINDINGS: Cardiovascular: The pulmonary arteries are well opacified with contrast to the level of the subsegmental branches. There is no evidence of acute pulmonary embolism. No significant systemic arterial abnormalities are identified. The heart size is normal. There is no pericardial effusion. Mediastinum/Nodes: There are no enlarged mediastinal, hilar or axillary lymph nodes.Calcified subcarinal nodes are likely postinflammatory. The thyroid gland, trachea and esophagus demonstrate no significant findings.  Lungs/Pleura: There are small bilateral pleural effusions with associated atelectasis at both lung bases. There is a calcified granuloma in the right middle lobe. No pneumothorax or suspicious pulmonary nodule. Generalized pulmonary interstitial prominence which may reflect edema. Upper abdomen:  The visualized upper abdomen appears unremarkable. Musculoskeletal/Chest wall: There is no chest wall mass or suspicious osseous finding. No evidence of rib fracture. Review of the MIP images confirms the above findings. IMPRESSION: 1. No evidence of acute pulmonary embolism or other acute vascular findings in the chest. 2. Sequela of prior granulomatous disease. 3. Pulmonary interstitial prominence and bilateral pleural effusions suggesting congestive heart failure. Electronically Signed   By: Carey Bullocks M.D.   On: 01/01/2022 17:46   DG Chest 2 View  Result Date: 01/01/2022 CLINICAL DATA:  Shortness of breath EXAM: CHEST - 2 VIEW COMPARISON:  Previous studies including the examination of 10/10/2021 FINDINGS: Transverse diameter of heart is increased. Central pulmonary vessels are more prominent. There is interval increase in interstitial markings in the parahilar regions and lower lung fields. Small bilateral pleural effusions are seen. There is no pneumothorax. IMPRESSION: Cardiomegaly. Central pulmonary vessels are more prominent. Increased interstitial markings are seen in the parahilar regions and lower lung fields. Findings suggest CHF. Small bilateral pleural effusions are seen. Electronically Signed   By: Ernie Avena M.D.   On: 01/01/2022 13:37     Assessment and Plan:   Acute heart failure symptoms -Volume overloaded, improving with diuresing -Continue IV Lasix 40 mg twice daily -Get echocardiogram -Further recommendations pending echo findings.  2.  Hypertension -Start losartan 25 mg daily -Lasix as above -Consider permissive hypertension due to acute stroke.  3.  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

## 2022-01-03 NOTE — Assessment & Plan Note (Signed)
  BMI= 42.50   and BW= 154.2 -Diet and exercise.   -Encourage to lose weight.

## 2022-01-03 NOTE — H&P (Signed)
History and Physical    Edwin Edwards [id number] DOB: 1961/04/10 DOA: 01/03/2022  Referring MD/NP/PA:   PCP: Patient, No Pcp Per   Patient coming from:  The patient is coming from home.    Chief Complaint: SOB, leg edema.  HPI: Edwin Edwards is a 61 y.o. male with medical history significant of HTN, DM, who presents with SOB and leg edema.  Patient states that he has SOB for more than 4 days, which has been progressively worsening.  Patient does not have cough.  Has chest pressure, no active chest pain.  No fever or chills.  Patient has orthopnea, shortness breath is worse when laying flat.  Patient has bilateral leg edema and more than 10 pounds of weight gain recently.  No nausea, vomiting, diarrhea or abdominal pain.  No symptoms of UTI.  Patient also reports headache, which has been well for more than a week.  Headache is located in the top of the head, constant, dull, nonradiating.  No neck rigidity.  No unilateral numbness or tinglings in extremities, no facial droop or slurred speech.  Data reviewed independently and ED Course: pt was found to have BNP 140, troponin level 32, 27, WBC 3.8, GFR> 60, temperature normal, blood pressure 186/103, 155/99, heart rate 87, RR 21, oxygen saturation 98% on room air.  Chest x-ray showed cardiomegaly.  CT angiogram is negative for PE, but showed pulmonary edema.  CT of head showed small right internal capsule hypodensity.  Patient is admitted to telemetry bed as inpatient.  Dr. Garen Lah of cardiology is consulted.   EKG: I have personally reviewed.  Sinus rhythm, QTc 484, LAE, incomplete right bundle blockage  MRI-barin 1. 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 2. Scattered subcortical T2 hyperintensities bilaterally are mildly advanced for age. The finding is nonspecific but can be seen in the setting of chronic microvascular ischemia, a demyelinating process such as multiple sclerosis,  vasculitis, complicated migraine headaches, or as the sequelae of a prior infectious or inflammatory process.  Review of Systems:   General: no fevers, chills, has body weight gain, has HA HEENT: no blurry vision, hearing changes or sore throat Respiratory: has dyspnea, no coughing, wheezing CV: has chest pressure, no palpitations GI: no nausea, vomiting, abdominal pain, diarrhea, constipation GU: no dysuria, burning on urination, increased urinary frequency, hematuria  Ext: has leg edema Neuro: no unilateral weakness, numbness, or tingling, no vision change or hearing loss Skin: no rash, no skin tear. MSK: No muscle spasm, no deformity, no limitation of range of movement in spin Heme: No easy bruising.  Travel history: No recent long distant travel.   Allergy: No Known Allergies  Past Medical History:  Diagnosis Date   Diabetes mellitus without complication ([place])    Hypertension     Past Surgical History:  Procedure Laterality Date   ABDOMINAL SURGERY     COLON SURGERY      Social History:  reports that he has never smoked. He has never used smokeless tobacco. He reports that he does not currently use alcohol. He reports that he does not currently use drugs.  Family History:  Family History  Problem Relation Age of Onset   Hypertension Mother    Diabetes Mother    Heart attack Mother      Prior to Admission medications   Medication Sig Start Date End Date Taking? Authorizing Provider  furosemide (LASIX) 20 MG tablet Take 1 tablet (20 mg total) by mouth daily for 7 days.  01/01/22 01/08/22 Yes Phineas SemenGoodman, Graydon, MD  insulin glargine (LANTUS) 100 UNIT/ML injection Inject 42 Units into the skin 2 (two) times daily. 08/13/21  Yes [provider]  HYDROcodone-acetaminophen (NORCO/VICODIN) 5-325 MG tablet Take 2 tablets by mouth every 6 (six) hours as needed for moderate pain or severe pain. 09/10/21   Loleta RoseForbach, Cory, MD  magic mouthwash w/lidocaine SOLN Take 5 mLs by  mouth 4 (four) times daily as needed for mouth pain. Swish and spit, do not swallow the solution. Patient not taking: Reported on 01/03/2022 09/10/21   Loleta RoseForbach, Cory, MD    Physical Exam: Vitals:   01/03/22 0930 01/03/22 1100 01/03/22 1230 01/03/22 1325  BP: (!) 162/100 (!) 161/89  (!) 142/97  Pulse: 77 90 91 87  Resp: 18 (!) 21 17 13   Temp:    97.6 F (36.4 C)  TempSrc:    Oral  SpO2: 97% 99% 98% 98%  Weight:      Height:       General: Not in acute distress HEENT:       Eyes: PERRL, EOMI, no scleral icterus.       ENT: No discharge from the ears and nose, no pharynx injection, no tonsillar enlargement.        Neck: Difficult to assess JVD due to morbid obesity, no bruit, no mass felt. Heme: No neck lymph node enlargement. Cardiac: S1/S2, RRR, No murmurs, No gallops or rubs. Respiratory:  has fine crackles bilaterally GI: Soft, nondistended, nontender, no rebound pain, no organomegaly, BS present. GU: No hematuria Ext: 2+ pitting leg edema bilaterally. 1+DP/PT pulse bilaterally. Musculoskeletal: No joint deformities, No joint redness or warmth, no limitation of ROM in spin. Skin: No rashes.  Neuro: Alert, oriented X3, cranial nerves II-XII grossly intact, moves all extremities normally. Psych: Patient is not psychotic, no suicidal or hemocidal ideation.  Labs on Admission: I have personally reviewed following labs and imaging studies  CBC: Recent Labs  Lab 01/01/22 1327 01/02/22 2134  WBC 4.2 3.8*  NEUTROABS 2.6 2.0  HGB 13.1 13.5  HCT 42.2 43.5  MCV 79.3* 79.5*  PLT 206 209   Basic Metabolic Panel: Recent Labs  Lab 01/01/22 1327 01/02/22 2134  NA 141 139  K 4.0 3.7  CL 108 106  CO2 23 24  GLUCOSE 118* 220*  BUN 12 15  CREATININE 1.06 1.23  CALCIUM 9.0 9.0   GFR: Estimated Creatinine Clearance: 101.5 mL/min (by C-G formula based on SCr of 1.23 mg/dL). Liver Function Tests: Recent Labs  Lab 01/02/22 2134  AST 33  ALT 27  ALKPHOS 68  BILITOT 1.0   PROT 7.5  ALBUMIN 3.8   No results for input(s): "LIPASE", "AMYLASE" in the last 168 hours. No results for input(s): "AMMONIA" in the last 168 hours. Coagulation Profile: No results for input(s): "INR", "PROTIME" in the last 168 hours. Cardiac Enzymes: No results for input(s): "CKTOTAL", "CKMB", "CKMBINDEX", "TROPONINI" in the last 168 hours. BNP (last 3 results) No results for input(s): "PROBNP" in the last 8760 hours. HbA1C: Recent Labs    01/03/22 0212  HGBA1C 8.2*   CBG: Recent Labs  Lab 01/02/22 2140 01/03/22 1314 01/03/22 1644  GLUCAP 199* 171* 167*   Lipid Profile: Recent Labs    01/03/22 1027  CHOL 180  HDL 43  LDLCALC 116*  TRIG 105  CHOLHDL 4.2   Thyroid Function Tests: No results for input(s): "TSH", "T4TOTAL", "FREET4", "T3FREE", "THYROIDAB" in the last 72 hours. Anemia Panel: No results for input(s): "VITAMINB12", "FOLATE", "  FERRITIN", "TIBC", "IRON", "RETICCTPCT" in the last 72 hours. Urine analysis: No results found for: "COLORURINE", "APPEARANCEUR", "LABSPEC", "PHURINE", "GLUCOSEU", "HGBUR", "BILIRUBINUR", "KETONESUR", "PROTEINUR", "UROBILINOGEN", "NITRITE", "LEUKOCYTESUR" Sepsis Labs: @LABRCNTIP (procalcitonin:4,lacticidven:4) ) Recent Results (from the past 240 hour(s))  Resp Panel by RT-PCR (Flu A&B, Covid) Anterior Nasal Swab     Status: None   Collection Time: 01/01/22  5:20 PM   Specimen: Anterior Nasal Swab  Result Value Ref Range Status   SARS Coronavirus 2 by RT PCR NEGATIVE NEGATIVE Final    Comment: (NOTE) SARS-CoV-2 target nucleic acids are NOT DETECTED.  The SARS-CoV-2 RNA is generally detectable in upper respiratory specimens during the acute phase of infection. The lowest concentration of SARS-CoV-2 viral copies this assay can detect is 138 copies/mL. A negative result does not preclude SARS-Cov-2 infection and should not be used as the sole basis for treatment or other patient management decisions. A negative result may occur  with  improper specimen collection/handling, submission of specimen other than nasopharyngeal swab, presence of viral mutation(s) within the areas targeted by this assay, and inadequate number of viral copies(<138 copies/mL). A negative result must be combined with clinical observations, patient history, and epidemiological information. The expected result is Negative.  Fact Sheet for Patients:  EntrepreneurPulse.com.au  Fact Sheet for Healthcare Providers:  IncredibleEmployment.be  This test is no t yet approved or cleared by the [place] FDA and  has been authorized for detection and/or diagnosis of SARS-CoV-2 by FDA under an Emergency Use Authorization (EUA). This EUA will remain  in effect (meaning this test can be used) for the duration of the COVID-19 declaration under Section 564(b)(1) of the Act, 21 U.S.C.section 360bbb-3(b)(1), unless the authorization is terminated  or revoked sooner.       Influenza A by PCR NEGATIVE NEGATIVE Final   Influenza B by PCR NEGATIVE NEGATIVE Final    Comment: (NOTE) The Xpert Xpress SARS-CoV-2/FLU/RSV plus assay is intended as an aid in the diagnosis of influenza from Nasopharyngeal swab specimens and should not be used as a sole basis for treatment. Nasal washings and aspirates are unacceptable for Xpert Xpress SARS-CoV-2/FLU/RSV testing.  Fact Sheet for Patients: EntrepreneurPulse.com.au  Fact Sheet for Healthcare Providers: IncredibleEmployment.be  This test is not yet approved or cleared by the [place] FDA and has been authorized for detection and/or diagnosis of SARS-CoV-2 by FDA under an Emergency Use Authorization (EUA). This EUA will remain in effect (meaning this test can be used) for the duration of the COVID-19 declaration under Section 564(b)(1) of the Act, 21 U.S.C. section 360bbb-3(b)(1), unless the authorization is terminated  or revoked.  Performed at [facility], [address] [place], [place] [address]      Radiological Exams on Admission: MR ANGIO HEAD WO CONTRAST  Result Date: 01/03/2022 CLINICAL DATA:  Right internal capsule acute infarct. EXAM: MRA HEAD WITHOUT CONTRAST TECHNIQUE: Angiographic images of the Circle of Willis were acquired using MRA technique without intravenous contrast. COMPARISON:  MR head without contrast 01/03/2022 FINDINGS: Anterior circulation: Internal carotid arteries are within normal limits high cervical segments through the ICA termini. The A1 and M1 segments are normal. Anterior communicating artery is patent. ACA and MCA branch vessels are within normal limits. Posterior circulation: The vertebral arteries are codominant. PICA origins are visualized. Vertebrobasilar junction artery are normal. Both posterior cerebral arteries originate from the basilar tip. Post median artery contributes. PCA branch vessels within normal limits bilaterally. Anatomic variants: None IMPRESSION: Normal MRA circle-of-Willis. No significant proximal stenosis, aneurysm, or  branch vessel occlusion. Electronically Signed   By: San Morelle M.D.   On: 01/03/2022 16:11   MR BRAIN WO CONTRAST  Result Date: 01/03/2022 CLINICAL DATA:  Headache, new or worsening. Progressive shortness of breath. EXAM: MRI HEAD WITHOUT CONTRAST TECHNIQUE: Multiplanar, multiecho pulse sequences of the brain and surrounding structures were obtained without intravenous contrast. COMPARISON:  A 9 mm acute scratched at CT head without contrast 01/02/22 FINDINGS: Brain: Acute nonhemorrhagic infarct is confirmed within the genu of the right internal capsule. Infarct measures 12 mm maximally on coronal images. T2 and FLAIR hyperintensities associated. No acute hemorrhage is scratched at no cc hemorrhage is present. Scattered subcortical T2 hyperintensities bilaterally are mildly advanced for age. No additional acute infarcts  are present. The ventricles are of normal size. No significant extraaxial fluid collection is present. The brainstem and cerebellum are within normal limits. Vascular: Flow is present in the major intracranial arteries. Skull and upper cervical spine: The craniocervical junction is normal. Upper cervical spine is within normal limits. Marrow signal is unremarkable. Sinuses/Orbits: The paranasal sinuses and mastoid air cells are clear. The globes and orbits are within normal limits. IMPRESSION: 1. 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 2. Scattered subcortical T2 hyperintensities bilaterally are mildly advanced for age. The finding is nonspecific but can be seen in the setting of chronic microvascular ischemia, a demyelinating process such as multiple sclerosis, vasculitis, complicated migraine headaches, or as the sequelae of a prior infectious or inflammatory process. These results will be called to the ordering clinician or representative by the Radiologist Assistant, and communication documented in the PACS or [employer]. Electronically Signed   By: San Morelle M.D.   On: 01/03/2022 14:48   DG Chest 2 View  Result Date: 01/02/2022 CLINICAL DATA:  Dyspnea EXAM: CHEST - 2 VIEW COMPARISON:  None Available. FINDINGS: The lungs are symmetrically well expanded. Benign calcified granuloma within the right mid lung zone. Lungs are clear. No pneumothorax or pleural effusion. Cardiac size is within normal limits. There is mild central pulmonary vascular engorgement, similar to prior examination, without superimposed overt pulmonary edema. IMPRESSION: Mild central pulmonary vascular engorgement. No overt pulmonary edema. Electronically Signed   By: Fidela Salisbury M.D.   On: 01/02/2022 22:21   CT Head Wo Contrast  Result Date: 01/02/2022 CLINICAL DATA:  Dizziness headache EXAM: CT HEAD WITHOUT CONTRAST TECHNIQUE: Contiguous axial images were obtained from the  base of the skull through the vertex without intravenous contrast. RADIATION DOSE REDUCTION: This exam was performed according to the departmental dose-optimization program which includes automated exposure control, adjustment of the mA and/or kV according to patient size and/or use of iterative reconstruction technique. COMPARISON:  CT brain 10/10/2021 FINDINGS: Brain: No hemorrhage or intracranial mass. Patchy white matter hypodensities consistent with chronic small vessel ischemic change. Hypodensity within the right internal capsule is new compared to prior and is consistent with infarct of uncertain age. The ventricles are nonenlarged. Vascular: No hyperdense vessels.  No unexpected calcification Skull: Normal. Negative for fracture or focal lesion. Sinuses/Orbits: No acute finding. Other: None IMPRESSION: 1. Negative for hemorrhage or mass 2. Hypodensity within the right internal capsule is new compared to prior head CT from June and consistent with small infarct of uncertain age, this could be correlated with MRI as indicated. 3. Elsewhere no significant change in chronic small vessel ischemic disease Electronically Signed   By: Donavan Foil M.D.   On: 01/02/2022 22:00      Assessment/Plan  Principal Problem:   Acute CHF (congestive heart failure) (HCC) Active Problems:   Myocardial injury   Diabetes mellitus without complication (HCC)   Stroke (HCC)   Hypertension   Obesity, Class III, BMI 40-49.9 (morbid obesity) (HCC)   Assessment and Plan: * Acute CHF (congestive heart failure) (HCC) Patient has 2+ leg edema, elevated BNP 140, weight gain, crackles on auscultation, clinically consistent with acute CHF.  No 2D echo on record, not sure which type of CHF.  Patient has long history of hypertension, indicating possible diastolic CHF.  Consulted Dr. Azucena Cecil  of cardiology.  -Will admit to tele bed as inpatient -Lasix 40 mg bid by IV -2d echo -Daily weights -strict I/O's -Low salt  diet -Fluid restriction -Obtain REDs Vest reading    Myocardial injury Myocardial injury due to acute CHF and stroke: Troponin level 32 --> 27.  No active chest pain. -Trend troponin -Aspirin -Lipitor 40 mg daily -Check A1c, FLP -Follow-up 2D echo  Stroke [facility]) Patient reports headache, MRI of the brain showed acute stroke.  No focal neurodeficit on physical examination.  Consulted Dr. Otelia Limes of neurology.  - Obtain MRA  - Allow permissive HTN in the setting of acute stroke, for SBP>185 (not SBP > 220 due to acute CHF) - Check carotid dopplers  -  ASA and lipitor - fasting lipid panel and HbA1c  - 2D transthoracic echocardiography  - swallowing screen. If fails, will get SLP - Check UDS  - PT/OT consult    Diabetes mellitus without complication (HCC) No A1c on record, blood sugar 220.  Patient is supposed to take Lantus 42 units twice daily (currently not taking) -Glargine insulin 20 units twice daily -Sliding scale insulin -Follow-up A1c  Hypertension -IV hydralazine as needed for SBP> 185 -Patient is on IV Lasix  Obesity, Class III, BMI 40-49.9 (morbid obesity) (HCC)  BMI= 42.50   and BW= 154.2 -Diet and exercise.   -Encourage to lose weight.           DVT ppx: SQ Lovenox  Code Status: Full code  Family Communication: Yes, patient's fianc on phone during the interview  Disposition Plan:  Anticipate discharge back to previous environment  Consults called: Dr. Azucena Cecil of cardiology  Admission status and Level of care: Telemetry Cardiac:   as inpt           Dispo: The patient is from: Home              Anticipated d/c is to: Home              Anticipated d/c date is: 2 days              Patient currently is not medically stable to d/c.    Severity of Illness:  The appropriate patient status for this patient is INPATIENT. Inpatient status is judged to be reasonable and necessary in order to provide the required intensity of service to  ensure the patient's safety. The patient's presenting symptoms, physical exam findings, and initial radiographic and laboratory data in the context of their chronic comorbidities is felt to place them at high risk for further clinical deterioration. Furthermore, it is not anticipated that the patient will be medically stable for discharge from the hospital within 2 midnights of admission.   * I certify that at the point of admission it is my clinical judgment that the patient will require inpatient hospital care spanning beyond 2 midnights from the point of admission due to high intensity of  service, high risk for further deterioration and high frequency of surveillance required.*       Date of Service 01/03/2022    Lorretta Harp [facility]   If 7PM-7AM, please contact night-coverage [email or web address] 01/03/2022, 6:11 PM

## 2022-01-03 NOTE — ED Notes (Signed)
Cardiologist at bedside. Pt will then go to MRI.

## 2022-01-03 NOTE — Consult Note (Signed)
NEURO HOSPITALIST CONSULT NOTE   Requestig physician: Dr. Clyde Lundborg  Reason for Consult: Subacute stroke on MRI  History obtained from:  Patient and Chart     HPI:                                                                                                                                          Edwin Edwards is a 61 y.o. male with a PMHx of DM and hypertension who presented to the ED yesterday night for increasing SOB and BLE swelling, in addition to headache. 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 Neurology was consulted for further evaluation.   Past Medical History:  Diagnosis Date   Diabetes mellitus without complication (HCC)    Hypertension     Past Surgical History:  Procedure Laterality Date   ABDOMINAL SURGERY     COLON SURGERY      Family History  Problem Relation Age of Onset   Hypertension Mother    Diabetes Mother    Heart attack Mother              Social History:  reports that he has never smoked. He has never used smokeless tobacco. He reports that he does not currently use alcohol. He reports that he does not currently use drugs.  No Known Allergies  MEDICATIONS:                                                                                                                     No current facility-administered medications on file prior to encounter.   Current Outpatient Medications on File Prior to Encounter  Medication Sig Dispense Refill   furosemide (LASIX) 20 MG tablet Take 1 tablet (20 mg total) by mouth daily for 7 days. 7 tablet 0   insulin glargine (LANTUS) 100 UNIT/ML injection Inject 42 Units into the skin 2 (two) times daily.     HYDROcodone-acetaminophen (NORCO/VICODIN) 5-325 MG tablet Take 2 tablets by mouth every 6 (six) hours as needed for moderate pain or severe pain. 8 tablet 0   magic mouthwash  w/lidocaine SOLN Take 5 mLs by mouth 4 (four) times daily as needed for mouth pain. Swish  and spit, do not swallow the solution. (Patient not taking: Reported on 01/03/2022) 360 mL 0     Scheduled:  [START ON 01/04/2022]  stroke: early stages of recovery book   Does not apply Once   [START ON 01/04/2022] aspirin  81 mg Oral Daily   atorvastatin  40 mg Oral Daily   enoxaparin (LOVENOX) injection  0.5 mg/kg Subcutaneous Q24H   furosemide  40 mg Intravenous BID   insulin aspart  0-5 Units Subcutaneous QHS   insulin aspart  0-9 Units Subcutaneous TID WC   insulin glargine-yfgn  20 Units Subcutaneous BID   losartan  25 mg Oral Daily    ROS:                                                                                                                                       Feels that his strength is normal in BUE and BLE. Does not endorse any aphasia, dysarthria, dysphagia, vision loss, facial droop, numbness or paresthesias. Other ROS as per HPI.   Blood pressure (!) 142/97, pulse 87, temperature 97.6 F (36.4 C), temperature source Oral, resp. rate 13, height 6\' 3"  (1.905 m), weight (!) 154.2 kg, SpO2 98 %.   General Examination:                                                                                                       Physical Exam  General: Morbidly obese HEENT-  [place]/AT    Lungs- Respirations unlabored Extremities- Swelling to distal BLE is noted.    Neurological Examination Mental Status: Awake and alert. Fully oriented. Thought content appropriate.  Speech fluent without evidence of aphasia.  No dysarthria. Able to follow all commands without difficulty. Cranial Nerves: II: Temporal visual fields intact with no extinction to DSS. PERRL. III,IV, VI: No ptosis. EOMI. No nystagmus. V: Temp sensation equal bilaterally VII: Smile symmetric VIII: Hearing intact to voice IX,X: No hypophonia or hoarseness XI: Symmetric XII: Midline tongue extension Motor: RUE: 5/5 RLE:  5/5 LUE: 5/5 LLE: 5/5 Sensory: Temp and FT intact x 4. No extinction to DSS. Deep Tendon Reflexes: 1+ and symmetric bilateral brachioradialis and patellae Cerebellar: No ataxia with FNF bilaterally Gait: Deferred   Lab Results: Basic Metabolic Panel: Recent Labs  Lab 01/01/22 1327 01/02/22 2134  NA 141 139  K 4.0 3.7  CL 108 106  CO2 23 24  GLUCOSE 118* 220*  BUN 12 15  CREATININE 1.06 1.23  CALCIUM 9.0 9.0  CBC: Recent Labs  Lab 01/01/22 1327 01/02/22 2134  WBC 4.2 3.8*  NEUTROABS 2.6 2.0  HGB 13.1 13.5  HCT 42.2 43.5  MCV 79.3* 79.5*  PLT 206 209    Cardiac Enzymes: No results for input(s): "CKTOTAL", "CKMB", "CKMBINDEX", "TROPONINI" in the last 168 hours.  Lipid Panel: Recent Labs  Lab 01/03/22 1027  CHOL 180  TRIG 105  HDL 43  CHOLHDL 4.2  VLDL 21  LDLCALC 443*    Imaging: MR BRAIN WO CONTRAST  Result Date: 01/03/2022 CLINICAL DATA:  Headache, new or worsening. Progressive shortness of breath. EXAM: MRI HEAD WITHOUT CONTRAST TECHNIQUE: Multiplanar, multiecho pulse sequences of the brain and surrounding structures were obtained without intravenous contrast. COMPARISON:  A 9 mm acute scratched at CT head without contrast 01/02/22 FINDINGS: Brain: Acute nonhemorrhagic infarct is confirmed within the genu of the right internal capsule. Infarct measures 12 mm maximally on coronal images. T2 and FLAIR hyperintensities associated. No acute hemorrhage is scratched at no cc hemorrhage is present. Scattered subcortical T2 hyperintensities bilaterally are mildly advanced for age. No additional acute infarcts are present. The ventricles are of normal size. No significant extraaxial fluid collection is present. The brainstem and cerebellum are within normal limits. Vascular: Flow is present in the major intracranial arteries. Skull and upper cervical spine: The craniocervical junction is normal. Upper cervical spine is within normal limits. Marrow signal is  unremarkable. Sinuses/Orbits: The paranasal sinuses and mastoid air cells are clear. The globes and orbits are within normal limits. IMPRESSION: 1. 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 2. Scattered subcortical T2 hyperintensities bilaterally are mildly advanced for age. The finding is nonspecific but can be seen in the setting of chronic microvascular ischemia, a demyelinating process such as multiple sclerosis, vasculitis, complicated migraine headaches, or as the sequelae of a prior infectious or inflammatory process. These results will be called to the ordering clinician or representative by the Radiologist Assistant, and communication documented in the PACS or [employer]. Electronically Signed   By: Marin Roberts M.D.   On: 01/03/2022 14:48   DG Chest 2 View  Result Date: 01/02/2022 CLINICAL DATA:  Dyspnea EXAM: CHEST - 2 VIEW COMPARISON:  None Available. FINDINGS: The lungs are symmetrically well expanded. Benign calcified granuloma within the right mid lung zone. Lungs are clear. No pneumothorax or pleural effusion. Cardiac size is within normal limits. There is mild central pulmonary vascular engorgement, similar to prior examination, without superimposed overt pulmonary edema. IMPRESSION: Mild central pulmonary vascular engorgement. No overt pulmonary edema. Electronically Signed   By: Helyn Numbers M.D.   On: 01/02/2022 22:21   CT Head Wo Contrast  Result Date: 01/02/2022 CLINICAL DATA:  Dizziness headache EXAM: CT HEAD WITHOUT CONTRAST TECHNIQUE: Contiguous axial images were obtained from the base of the skull through the vertex without intravenous contrast. RADIATION DOSE REDUCTION: This exam was performed according to the departmental dose-optimization program which includes automated exposure control, adjustment of the mA and/or kV according to patient size and/or use of iterative reconstruction technique. COMPARISON:  CT brain 10/10/2021  FINDINGS: Brain: No hemorrhage or intracranial mass. Patchy white matter hypodensities consistent with chronic small vessel ischemic change. Hypodensity within the right internal capsule is new compared to prior and is consistent with infarct of uncertain age. The ventricles are nonenlarged. Vascular: No hyperdense vessels.  No unexpected calcification Skull: Normal. Negative for fracture or focal lesion. Sinuses/Orbits: No acute finding. Other: None IMPRESSION: 1. Negative for hemorrhage or mass  2. Hypodensity within the right internal capsule is new compared to prior head CT from June and consistent with small infarct of uncertain age, this could be correlated with MRI as indicated. 3. Elsewhere no significant change in chronic small vessel ischemic disease Electronically Signed   By: Jasmine Pang M.D.   On: 01/02/2022 22:00   CT Angio Chest PE W and/or Wo Contrast  Result Date: 01/01/2022 CLINICAL DATA:  Chest pain or shortness of breath, pleurisy or effusion suspected. Shortness of breath for 4 days. EXAM: CT ANGIOGRAPHY CHEST WITH CONTRAST TECHNIQUE: Multidetector CT imaging of the chest was performed using the standard protocol during bolus administration of intravenous contrast. Multiplanar CT image reconstructions and MIPs were obtained to evaluate the vascular anatomy. RADIATION DOSE REDUCTION: This exam was performed according to the departmental dose-optimization program which includes automated exposure control, adjustment of the mA and/or kV according to patient size and/or use of iterative reconstruction technique. CONTRAST:  OMNIPAQUE IOHEXOL 350 MG/ML SOLN COMPARISON:  Chest radiographs today and 10/10/2021. FINDINGS: Cardiovascular: The pulmonary arteries are well opacified with contrast to the level of the subsegmental branches. There is no evidence of acute pulmonary embolism. No significant systemic arterial abnormalities are identified. The heart size is normal. There is no  pericardial effusion. Mediastinum/Nodes: There are no enlarged mediastinal, hilar or axillary lymph nodes.Calcified subcarinal nodes are likely postinflammatory. The thyroid gland, trachea and esophagus demonstrate no significant findings. Lungs/Pleura: There are small bilateral pleural effusions with associated atelectasis at both lung bases. There is a calcified granuloma in the right middle lobe. No pneumothorax or suspicious pulmonary nodule. Generalized pulmonary interstitial prominence which may reflect edema. Upper abdomen:  The visualized upper abdomen appears unremarkable. Musculoskeletal/Chest wall: There is no chest wall mass or suspicious osseous finding. No evidence of rib fracture. Review of the MIP images confirms the above findings. IMPRESSION: 1. No evidence of acute pulmonary embolism or other acute vascular findings in the chest. 2. Sequela of prior granulomatous disease. 3. Pulmonary interstitial prominence and bilateral pleural effusions suggesting congestive heart failure. Electronically Signed   By: Carey Bullocks M.D.   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  - MRI brain: Subacute ischemic infarction overlapping the right anterior thalamus and genu of the right internal capsule  Scattered subcortical T2 hyperintensities bilaterally are mildly advanced for age and appear most consistent with chronic microvascular ischemia. - MRA head: Normal MRA [place]. No significant proximal stenosis, aneurysm, or branch vessel occlusion.  - Carotid ultrasound: No evidence of flow limiting stenosis. - EKG: Normal sinus rhythm; Possible Left atrial enlargement; Low voltage QRS; Incomplete right bundle branch block; Prolonged QT - Labs: - CMP unremarkable except for elevated glucose. BUN and Cr are normal.  -  Cholesterol panel with elevated LDL but otherwise normal.  - HgbA1c elevated at 8.2 - UDS negative - Stroke risk factors: DM, morbid obesity and HTN  Recommendations: - Agree with starting ASA and atorvastatin - BP management. Based on the appearance of the stroke on MRI, it is most likely > 24 hours old - TTE - PT consult, OT consult, Speech consult - Risk factor modification to include elimination of sugar from diet, gradual weight loss and light exercise - Telemetry monitoring - Frequent neuro checks - NPO until passes stroke swallow screen - Glycemic control  Electronically signed: Dr. Caryl Pina 01/03/2022, 3:48 PM

## 2022-01-03 NOTE — ED Notes (Signed)
Pt on phone with MRI

## 2022-01-03 NOTE — ED Notes (Signed)
Messaged pharmacy for glargine due at 1100.

## 2022-01-03 NOTE — Assessment & Plan Note (Addendum)
Patient reports headache, MRI of the brain showed acute stroke.  No focal neurodeficit on physical examination.  Consulted Dr. Otelia Limes of neurology.  - Obtain MRA  - Allow permissive HTN in the setting of acute stroke, for SBP>185 (not SBP > 220 due to acute CHF) - Check carotid dopplers  -  ASA and lipitor - fasting lipid panel and HbA1c  - 2D transthoracic echocardiography  - swallowing screen. If fails, will get SLP - Check UDS  - PT/OT consult

## 2022-01-03 NOTE — ED Notes (Signed)
Tried twice for blood work unsuccessful called lab

## 2022-01-03 NOTE — Assessment & Plan Note (Signed)
-  IV hydralazine as needed for SBP> 185 -Patient is on IV Lasix

## 2022-01-03 NOTE — Progress Notes (Signed)
SLP Cancellation Note  Patient Details Name: Edwin Edwards MRN: [id number] DOB: 09/10/1960   Cancelled treatment:       Reason Eval/Treat Not Completed: SLP screened, no needs identified, will sign off (chart reviewed; consulted NSG then met w/ pt in room at dinner meal.) Pt admitted to the ED w/ worsening SOB, leg edema, and complaint of headache prompting eval with MRI of brain which showed acute nonhemorrhagic infarct in the right internal capsule.  Pt denied any difficulty swallowing and is currently on a regular diet; tolerates swallowing pills w/ water per NSG. Pt finishing his Dinner meal while in room. Pt conversed in conversation w/out expressive/receptive deficits noted; pt denied any speech-language deficits. Speech clear. No further skilled ST services indicated as pt appears at his baseline. Pt agreed. NSG to reconsult if any change in status while admitted.       Orinda Kenner, MS, CCC-SLP Speech Language Pathologist Rehab Services; [place] [phone] (ascom) Janyth Riera 01/03/2022, 5:10 PM

## 2022-01-03 NOTE — ED Provider Notes (Signed)
[name] Provider Note    Event Date/Time   First MD Initiated Contact with Patient 01/03/22 0510     (approximate)   History   Shortness of Breath (/)   HPI {Remember to add pertinent medical, surgical, social, and/or OB history to HPI:1} Edwin Edwards is a 61 y.o. male  ***       Past Medical History   Past Medical History:  Diagnosis Date  . Diabetes mellitus without complication ([place])   . Hypertension      Active Problem List  There are no problems to display for this patient.    Past Surgical History   Past Surgical History:  Procedure Laterality Date  . ABDOMINAL SURGERY    . COLON SURGERY       Home Medications   Prior to Admission medications   Medication Sig Start Date End Date Taking? Authorizing Provider  furosemide (LASIX) 20 MG tablet Take 1 tablet (20 mg total) by mouth daily for 7 days. 01/01/22 01/08/22  Nance Pear, MD  HYDROcodone-acetaminophen (NORCO/VICODIN) 5-325 MG tablet Take 2 tablets by mouth every 6 (six) hours as needed for moderate pain or severe pain. 09/10/21   Hinda Kehr, MD  magic mouthwash w/lidocaine SOLN Take 5 mLs by mouth 4 (four) times daily as needed for mouth pain. Swish and spit, do not swallow the solution. 09/10/21   Hinda Kehr, MD     Allergies  Patient has no known allergies.   Family History  History reviewed. No pertinent family history.   Physical Exam  Triage Vital Signs: ED Triage Vitals  Enc Vitals Group     BP 01/02/22 2127 (!) 186/103     Pulse Rate 01/02/22 2127 87     Resp 01/02/22 2127 20     Temp 01/02/22 2127 98.8 F (37.1 C)     Temp Source 01/02/22 2127 Oral     SpO2 01/02/22 2127 93 %     Weight 01/02/22 2126 (!) 340 lb (154.2 kg)     Height 01/02/22 2126 6\' 3"  (1.905 m)     Head Circumference --      Peak Flow --      Pain Score 01/02/22 2135 10     Pain Loc --      Pain Edu? --      Excl. in [place]? --     Updated Vital Signs: BP (!) 161/109  (BP Location: Left Arm)   Pulse 73   Temp 98.5 F (36.9 C) (Oral)   Resp 18   Ht 6\' 3"  (1.905 m)   Wt (!) 154.2 kg   SpO2 97%   BMI 42.50 kg/m   {Only need to document appropriate and relevant physical exam:1} General: Awake, no distress. *** CV:  Good peripheral perfusion. *** Resp:  Normal effort. *** Abd:  No distention. *** Other:  ***   ED Results / Procedures / Treatments  Labs (all labs ordered are listed, but only abnormal results are displayed) Labs Reviewed  CBC WITH DIFFERENTIAL/PLATELET - Abnormal; Notable for the following components:      Result Value   WBC 3.8 (*)    MCV 79.5 (*)    MCH 24.7 (*)    All other components within normal limits  COMPREHENSIVE METABOLIC PANEL - Abnormal; Notable for the following components:   Glucose, Bld 220 (*)    All other components within normal limits  BRAIN NATRIURETIC PEPTIDE - Abnormal; Notable for the following components:  B Natriuretic Peptide 140.7 (*)    All other components within normal limits  CBG MONITORING, ED - Abnormal; Notable for the following components:   Glucose-Capillary 199 (*)    All other components within normal limits  TROPONIN I (HIGH SENSITIVITY) - Abnormal; Notable for the following components:   Troponin I (High Sensitivity) 32 (*)    All other components within normal limits  TROPONIN I (HIGH SENSITIVITY) - Abnormal; Notable for the following components:   Troponin I (High Sensitivity) 27 (*)    All other components within normal limits     EKG  ***   RADIOLOGY *** {You MUST document your own interpretation of imaging, as well as the fact that you reviewed the radiologist's report!:1}  Official radiology report(s): DG Chest 2 View  Result Date: 01/02/2022 CLINICAL DATA:  Dyspnea EXAM: CHEST - 2 VIEW COMPARISON:  None Available. FINDINGS: The lungs are symmetrically well expanded. Benign calcified granuloma within the right mid lung zone. Lungs are clear. No pneumothorax or  pleural effusion. Cardiac size is within normal limits. There is mild central pulmonary vascular engorgement, similar to prior examination, without superimposed overt pulmonary edema. IMPRESSION: Mild central pulmonary vascular engorgement. No overt pulmonary edema. Electronically Signed   By: Helyn Numbers M.D.   On: 01/02/2022 22:21   CT Head Wo Contrast  Result Date: 01/02/2022 CLINICAL DATA:  Dizziness headache EXAM: CT HEAD WITHOUT CONTRAST TECHNIQUE: Contiguous axial images were obtained from the base of the skull through the vertex without intravenous contrast. RADIATION DOSE REDUCTION: This exam was performed according to the departmental dose-optimization program which includes automated exposure control, adjustment of the mA and/or kV according to patient size and/or use of iterative reconstruction technique. COMPARISON:  CT brain 10/10/2021 FINDINGS: Brain: No hemorrhage or intracranial mass. Patchy white matter hypodensities consistent with chronic small vessel ischemic change. Hypodensity within the right internal capsule is new compared to prior and is consistent with infarct of uncertain age. The ventricles are nonenlarged. Vascular: No hyperdense vessels.  No unexpected calcification Skull: Normal. Negative for fracture or focal lesion. Sinuses/Orbits: No acute finding. Other: None IMPRESSION: 1. Negative for hemorrhage or mass 2. Hypodensity within the right internal capsule is new compared to prior head CT from June and consistent with small infarct of uncertain age, this could be correlated with MRI as indicated. 3. Elsewhere no significant change in chronic small vessel ischemic disease Electronically Signed   By: Jasmine Pang M.D.   On: 01/02/2022 22:00     PROCEDURES:  Critical Care performed: {CriticalCareYesNo:19197::"Yes, see critical care procedure note(s)","No"}  Procedures   MEDICATIONS ORDERED IN ED: Medications  furosemide (LASIX) injection 20 mg (has no administration  in time range)  aspirin chewable tablet 324 mg (has no administration in time range)  hydrALAZINE (APRESOLINE) injection 10 mg (has no administration in time range)     IMPRESSION / MDM / ASSESSMENT AND PLAN / ED COURSE  I reviewed the triage vital signs and the nursing notes.                              Differential diagnosis includes, but is not limited to, ***  Patient's presentation is most consistent with {EM COPA:27473}  {If the patient is on the monitor, remove the brackets and asterisks on the sentence below and remember to document it as a Procedure as well. Otherwise delete the sentence below:1} {**The patient is on the cardiac monitor to  evaluate for evidence of arrhythmia and/or significant heart rate changes.**}  {Remember to include, when applicable, any/all of the following data: independent review of imaging independent review of labs (comment specifically on pertinent positives and negatives) review of specific prior hospitalizations, PCP/specialist notes, etc. discuss meds given and prescribed document any discussion with consultants (including hospitalists) any clinical decision tools you used and why (PECARN, NEXUS, etc.) did you consider admitting the patient? document social determinants of health affecting patient's care (homelessness, inability to follow up in a timely fashion, etc) document any pre-existing conditions increasing risk on current visit (e.g. diabetes and HTN increasing danger of high-risk chest pain/ACS) describes what meds you gave (especially parenteral) and why any other interventions?:1}      FINAL CLINICAL IMPRESSION(S) / ED DIAGNOSES   Final diagnoses:  Shortness of breath  Chest pain, unspecified type  Hypertensive urgency     Rx / DC Orders   ED Discharge Orders     None        Note:  This document was prepared using Dragon voice recognition software and may include unintentional dictation errors.

## 2022-01-03 NOTE — ED Notes (Signed)
Glargine still not sent from pharmacy. Messaged already. Called pharmacy to have send.

## 2022-01-03 NOTE — Assessment & Plan Note (Signed)
No A1c on record, blood sugar 220.  Patient is supposed to take Lantus 42 units twice daily (currently not taking) -Glargine insulin 20 units twice daily -Sliding scale insulin -Follow-up A1c

## 2022-01-03 NOTE — Assessment & Plan Note (Signed)
Patient has 2+ leg edema, elevated BNP 140, weight gain, crackles on auscultation, clinically consistent with acute CHF.  No 2D echo on record, not sure which type of CHF.  Patient has long history of hypertension, indicating possible diastolic CHF.  Consulted Dr. Azucena Cecil  of cardiology.  -Will admit to tele bed as inpatient -Lasix 40 mg bid by IV -2d echo -Daily weights -strict I/O's -Low salt diet -Fluid restriction -Obtain REDs Vest reading

## 2022-01-03 NOTE — Assessment & Plan Note (Signed)
Myocardial injury due to acute CHF and stroke: Troponin level 32 --> 27.  No active chest pain. -Trend troponin -Aspirin -Lipitor 40 mg daily -Check A1c, FLP -Follow-up 2D echo

## 2022-01-04 DIAGNOSIS — I5021 Acute systolic (congestive) heart failure: Secondary | ICD-10-CM | POA: Diagnosis not present

## 2022-01-04 DIAGNOSIS — I639 Cerebral infarction, unspecified: Secondary | ICD-10-CM | POA: Diagnosis not present

## 2022-01-04 DIAGNOSIS — I10 Essential (primary) hypertension: Secondary | ICD-10-CM | POA: Diagnosis not present

## 2022-01-04 LAB — BASIC METABOLIC PANEL
Anion gap: 7 (ref 5–15)
BUN: 11 mg/dL (ref 6–20)
CO2: 27 mmol/L (ref 22–32)
Calcium: 8.8 mg/dL — ABNORMAL LOW (ref 8.9–10.3)
Chloride: 106 mmol/L (ref 98–111)
Creatinine, Ser: 0.95 mg/dL (ref 0.61–1.24)
GFR, Estimated: >60 mL/min (ref >60)
Glucose, Bld: 121 mg/dL — ABNORMAL HIGH (ref 70–99)
Potassium: 3.7 mmol/L (ref 3.5–5.1)
Sodium: 140 mmol/L (ref 135–145)

## 2022-01-04 LAB — CBG MONITORING, ED
Glucose-Capillary: 127 mg/dL — ABNORMAL HIGH (ref 70–99)
Glucose-Capillary: 165 mg/dL — ABNORMAL HIGH (ref 70–99)
Glucose-Capillary: 104 mg/dL — ABNORMAL HIGH (ref 70–99)

## 2022-01-04 LAB — MAGNESIUM: Magnesium: 2 mg/dL (ref 1.7–2.4)

## 2022-01-04 LAB — GLUCOSE, CAPILLARY: Glucose-Capillary: 106 mg/dL — ABNORMAL HIGH (ref 70–99)

## 2022-01-04 LAB — HIV ANTIBODY (ROUTINE TESTING W REFLEX): HIV Screen 4th Generation wRfx: NONREACTIVE

## 2022-01-04 MED ORDER — FUROSEMIDE 10 MG/ML IJ SOLN
40.0000 mg | Freq: Three times a day (TID) | INTRAMUSCULAR | Status: DC
  Administered 2022-01-04 – 2022-01-05 (×3): 40 mg via INTRAVENOUS
  Filled 2022-01-04 (×3): qty 4

## 2022-01-04 MED ORDER — LOSARTAN POTASSIUM 50 MG PO TABS
50.0000 mg | ORAL_TABLET | Freq: Every day | ORAL | Status: DC
  Administered 2022-01-05: 50 mg via ORAL
  Filled 2022-01-04: qty 1

## 2022-01-04 MED ORDER — FUROSEMIDE 10 MG/ML IJ SOLN: 40.0000 mg | Freq: Three times a day (TID) | INTRAMUSCULAR | Status: DC

## 2022-01-04 MED ORDER — FUROSEMIDE 10 MG/ML IJ SOLN: 40.0000 mg | Freq: Two times a day (BID) | INTRAMUSCULAR | Status: DC

## 2022-01-04 NOTE — Progress Notes (Signed)
[facility]  - [facility]   PATIENT NAME: Edwin Edwards    MR#:  [id number]  DATE OF BIRTH:  [birth year]  SUBJECTIVE:  patient came in with increasing shortness of breath and leg swelling. He also reported headache for more than a week.    VITALS:  Blood pressure 130/78, pulse 85, temperature 98.2 F (36.8 C), temperature source Oral, resp. rate 18, height 6\' 3"  (1.905 m), weight (!) 154.2 kg, SpO2 94 %.  PHYSICAL EXAMINATION:   GENERAL:  61 y.o.-year-old patient lying in the bed with Edwin acute distress.  LUNGS: Normal breath sounds bilaterally, Edwin wheezing, rales, rhonchi.  CARDIOVASCULAR: S1, S2 normal. Edwin murmurs, rubs, or gallops.  ABDOMEN: Soft, nontender, nondistended. Bowel sounds present.  EXTREMITIES:++ edema b/l.    NEUROLOGIC: nonfocal  patient is alert and awake SKIN: Edwin obvious rash, lesion, or ulcer.   LABORATORY PANEL:  CBC Recent Labs  Lab 01/02/22 2134  WBC 3.8*  HGB 13.5  HCT 43.5  PLT 209    Chemistries  Recent Labs  Lab 01/02/22 2134 01/04/22 0651  NA 139 140  K 3.7 3.7  CL 106 106  CO2 24 27  GLUCOSE 220* 121*  BUN 15 11  CREATININE 1.23 0.95  CALCIUM 9.0 8.8*  MG  --  2.0  AST 33  --   ALT 27  --   ALKPHOS 68  --   BILITOT 1.0  --     Assessment and Plan   Edwin Edwards is a 61 y.o. male with medical history significant of HTN, DM, who presents with SOB and leg edema.  Patient states that he has SOB for more than 4 days, which has been progressively worsening. Patient has bilateral leg edema and more than 10 pounds of weight gain recently.  Edwin nausea, vomiting, diarrhea or abdominal pain.  Edwin symptoms of UTI.  Patient also reports headache, which has been well for more than a week.   MRI-Brain 1. 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 2. Scattered subcortical T2 hyperintensities bilaterally are mildly advanced for age. The finding is nonspecific but can be seen  in the setting of chronic microvascular ischemia, a demyelinating process such as multiple sclerosis, vasculitis, complicated migraine headaches, or as the sequelae of a prior infectious or inflammatory process.  CXR Mild central pulmonary vascular engorgement. Edwin overt pulmonary edema.  Acute CHF (congestive heart failure) (HCC)--new Patient has 2+ leg edema, elevated BNP 140, weight gain, crackles on auscultation, clinically consistent with acute CHF. --  Edwin 2D echo on record, not sure which type of CHF.  Patient has long history of hypertension, indicating possible diastolic CHF.  -- Consulted Dr. 67  of cardiology.  -Lasix 40 mg tid by IV -2d echo -Daily weights -strict I/O's -Low salt diet   Myocardial injury due to acute CHF and stroke: Troponin level 32 --> 27.  Edwin active chest pain. -Trend troponin -Aspirin -Lipitor 40 mg daily -Follow-up 2D echo   Stroke [facility]), acute, right IC Patient reports headache, MRI of the brain showed acute stroke.  Edwin focal neurodeficit on physical examination.  -- Consulted [facility]. [facility] of neurology.  - Allow permissive HTN in the setting of acute stroke, for SBP>185 (not SBP > 220 due to acute CHF) - carotid dopplers --Edwin stenosis -  ASA and lipitor -- PT/OT consult  Diabetes mellitus without complication (HCC) Edwin A1c on record, blood sugar 220.  Patient is supposed to take Lantus  42 units twice daily (currently not taking) -Glargine insulin 20 units twice daily -Sliding scale insulin -Follow-up A1c   Hypertension -IV hydralazine as needed for SBP> 185 -Patient is on IV Lasix --losartan   Obesity, Class III, BMI 40-49.9 (morbid obesity) (HCC)  BMI= 42.50   and BW= 154.2 -Diet and exercise.   -Encourage to lose weight.     Procedures: Family communication : none today Consults :Neurology, Cardiology CODE STATUS: Full DVT Prophylaxis : lovenox Level of care: Telemetry Cardiac Status is: Inpatient Remains inpatient  appropriate because: CVA, CHF    TOTAL TIME TAKING CARE OF THIS PATIENT: 35 minutes.  >50% time spent on counselling and coordination of care  Note: This dictation was prepared with Dragon dictation along with smaller phrase technology. Any transcriptional errors that result from this process are unintentional.  Enedina Finner M.D    [facility]   CC: Primary care physician; Patient, Edwin Edwards

## 2022-01-04 NOTE — Progress Notes (Signed)
PT Cancellation Note  Patient Details Name: Edwin Edwards MRN: [id number] DOB: 1960-09-08   Cancelled Treatment:    Reason Eval/Treat Not Completed: Other (comment). Pt speaking with PT, stated he has been up and moving the bathroom all morning. Pt performed bed mobility, transfers, and ambulation independently to get to the bathroom while talking to PT. The patient demonstrated and reported return to baseline level of functioning, no further acute PT needs indicated. PT to sign off. Please reconsult PT if pt status changes or acute needs are identified.    Olga Coaster PT, DPT 9:59 AM,01/04/22

## 2022-01-04 NOTE — Progress Notes (Signed)
Rounding Note    Patient Name: Weston Kallman Date of Encounter: 01/04/2022  [facility] Cardiologist: Ag[place]Etang rounding  Subjective   Denies chest pain, shortness of breath and leg edema is much better.  Lasix increased to 3 times daily, but this was reduced by primary team  Inpatient Medications    Scheduled Meds:   stroke: early stages of recovery book   Does not apply Once   aspirin  81 mg Oral Daily   atorvastatin  40 mg Oral Daily   enoxaparin (LOVENOX) injection  0.5 mg/kg Subcutaneous Q24H   furosemide  40 mg Intravenous Q12H   insulin aspart  0-5 Units Subcutaneous QHS   insulin aspart  0-9 Units Subcutaneous TID WC   insulin glargine-yfgn  20 Units Subcutaneous BID   [START ON 01/05/2022] losartan  50 mg Oral Daily   Continuous Infusions:  PRN Meds: acetaminophen, albuterol, dextromethorphan-guaiFENesin, hydrALAZINE, HYDROcodone-acetaminophen, ondansetron (ZOFRAN) IV   Vital Signs    Vitals:   01/03/22 2300 01/04/22 0300 01/04/22 0829 01/04/22 1302  BP: (!) 146/88 (!) 152/91 (!) 155/89 130/78  Pulse: 75 71 87 85  Resp: 15 16 16 18   Temp: 98 F (36.7 C) 98.1 F (36.7 C)  98.2 F (36.8 C)  TempSrc: Oral Oral  Oral  SpO2: 97% 97% 96% 94%  Weight:      Height:        Intake/Output Summary (Last 24 hours) at 01/04/2022 1344 Last data filed at 01/03/2022 1600 Gross per 24 hour  Intake --  Output 800 ml  Net -800 ml      01/02/2022    9:26 PM 10/10/2021    9:46 AM 09/09/2021   10:41 PM  Last 3 Weights  Weight (lbs) 340 lb 320 lb 310 lb  Weight (kg) 154.223 kg 145.151 kg 140.615 kg      Telemetry    Sinus rhythm- Personally Reviewed  ECG     - Personally Reviewed  Physical Exam   GEN: No acute distress.   Neck: No JVD Cardiac: RRR, no murmurs, rubs, or gallops.  Respiratory: Clear to auscultation bilaterally. GI: Soft, nontender, non-distended  MS: 2+ edema; No deformity. Neuro:  Nonfocal  Psych: Normal affect   Labs     High Sensitivity Troponin:   Recent Labs  Lab 01/01/22 1327 01/01/22 1720 01/02/22 2134 01/03/22 0212 01/03/22 1027  TROPONINIHS 39* 31* 32* 27* 22*     Chemistry Recent Labs  Lab 01/01/22 1327 01/02/22 2134 01/04/22 0651  NA 141 139 140  K 4.0 3.7 3.7  CL 108 106 106  CO2 23 24 27   GLUCOSE 118* 220* 121*  BUN 12 15 11   CREATININE 1.06 1.23 0.95  CALCIUM 9.0 9.0 8.8*  MG  --   --  2.0  PROT  --  7.5  --   ALBUMIN  --  3.8  --   AST  --  33  --   ALT  --  27  --   ALKPHOS  --  68  --   BILITOT  --  1.0  --   GFRNONAA >60 >60 >60  ANIONGAP 10 9 7     Lipids  Recent Labs  Lab 01/03/22 1027  CHOL 180  TRIG 105  HDL 43  LDLCALC 116*  CHOLHDL 4.2    Hematology Recent Labs  Lab 01/01/22 1327 01/02/22 2134  WBC 4.2 3.8*  RBC 5.32 5.47  HGB 13.1 13.5  HCT 42.2 43.5  MCV 79.3* 79.5*  MCH 24.6* 24.7*  MCHC 31.0 31.0  RDW 14.5 14.6  PLT 206 209   Thyroid No results for input(s): "TSH", "FREET4" in the last 168 hours.  BNP Recent Labs  Lab 01/01/22 1327 01/02/22 2134  BNP 145.4* 140.7*    DDimer  Recent Labs  Lab 01/01/22 1327  DDIMER 1.58*     Radiology    US Carotid Bilateral (at [facility] and [facility] only)  Result Date: 01/03/2022 CLINICAL DATA:  Stroke EXAM: BILATERAL CAROTID DUPLEX ULTRASOUND TECHNIQUE: Wallace Cullens scale imaging, color Doppler and duplex ultrasound were performed of bilateral carotid and vertebral arteries in the neck. COMPARISON:  None Available. FINDINGS: Criteria: Quantification of carotid stenosis is based on velocity parameters that correlate the residual internal carotid diameter with NASCET-based stenosis levels, using the diameter of the distal internal carotid lumen as the denominator for stenosis measurement. The following velocity measurements were obtained: RIGHT ICA: 62 cm/sec CCA: 70 cm/sec SYSTOLIC ICA/CCA RATIO:  0.9 ECA: 60 cm/sec LEFT ICA: 76 cm/sec CCA: 75 cm/sec SYSTOLIC ICA/CCA RATIO:  1.0 ECA: 55 cm/sec RIGHT CAROTID ARTERY:  No significant atherosclerotic disease visualized on grayscale imaging. RIGHT VERTEBRAL ARTERY:  Antegrade LEFT CAROTID ARTERY: No significant atherosclerotic disease visualized on grayscale imaging. LEFT VERTEBRAL ARTERY:  Antegrade IMPRESSION: No evidence of flow limiting stenosis. Electronically Signed   By: Allegra Lai M.D.   On: 01/03/2022 18:39   MR ANGIO HEAD WO CONTRAST  Result Date: 01/03/2022 CLINICAL DATA:  Right internal capsule acute infarct. EXAM: MRA HEAD WITHOUT CONTRAST TECHNIQUE: Angiographic images of the Circle of Willis were acquired using MRA technique without intravenous contrast. COMPARISON:  MR head without contrast 01/03/2022 FINDINGS: Anterior circulation: Internal carotid arteries are within normal limits high cervical segments through the ICA termini. The A1 and M1 segments are normal. Anterior communicating artery is patent. ACA and MCA branch vessels are within normal limits. Posterior circulation: The vertebral arteries are codominant. PICA origins are visualized. Vertebrobasilar junction artery are normal. Both posterior cerebral arteries originate from the basilar tip. Post median artery contributes. PCA branch vessels within normal limits bilaterally. Anatomic variants: None IMPRESSION: Normal MRA circle-of-Willis. No significant proximal stenosis, aneurysm, or branch vessel occlusion. Electronically Signed   By: Marin Roberts M.D.   On: 01/03/2022 16:11   MR BRAIN WO CONTRAST  Result Date: 01/03/2022 CLINICAL DATA:  Headache, new or worsening. Progressive shortness of breath. EXAM: MRI HEAD WITHOUT CONTRAST TECHNIQUE: Multiplanar, multiecho pulse sequences of the brain and surrounding structures were obtained without intravenous contrast. COMPARISON:  A 9 mm acute scratched at CT head without contrast 01/02/22 FINDINGS: Brain: Acute nonhemorrhagic infarct is confirmed within the genu of the right internal capsule. Infarct measures 12 mm maximally on coronal  images. T2 and FLAIR hyperintensities associated. No acute hemorrhage is scratched at no cc hemorrhage is present. Scattered subcortical T2 hyperintensities bilaterally are mildly advanced for age. No additional acute infarcts are present. The ventricles are of normal size. No significant extraaxial fluid collection is present. The brainstem and cerebellum are within normal limits. Vascular: Flow is present in the major intracranial arteries. Skull and upper cervical spine: The craniocervical junction is normal. Upper cervical spine is within normal limits. Marrow signal is unremarkable. Sinuses/Orbits: The paranasal sinuses and mastoid air cells are clear. The globes and orbits are within normal limits. IMPRESSION: 1. 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 2. Scattered subcortical T2 hyperintensities bilaterally are mildly advanced for age. The finding is nonspecific but can be seen  in the setting of chronic microvascular ischemia, a demyelinating process such as multiple sclerosis, vasculitis, complicated migraine headaches, or as the sequelae of a prior infectious or inflammatory process. These results will be called to the ordering clinician or representative by the Radiologist Assistant, and communication documented in the PACS or [employer]. Electronically Signed   By: Marin Roberts M.D.   On: 01/03/2022 14:48   DG Chest 2 View  Result Date: 01/02/2022 CLINICAL DATA:  Dyspnea EXAM: CHEST - 2 VIEW COMPARISON:  None Available. FINDINGS: The lungs are symmetrically well expanded. Benign calcified granuloma within the right mid lung zone. Lungs are clear. No pneumothorax or pleural effusion. Cardiac size is within normal limits. There is mild central pulmonary vascular engorgement, similar to prior examination, without superimposed overt pulmonary edema. IMPRESSION: Mild central pulmonary vascular engorgement. No overt pulmonary edema. Electronically Signed    By: Helyn Numbers M.D.   On: 01/02/2022 22:21   CT Head Wo Contrast  Result Date: 01/02/2022 CLINICAL DATA:  Dizziness headache EXAM: CT HEAD WITHOUT CONTRAST TECHNIQUE: Contiguous axial images were obtained from the base of the skull through the vertex without intravenous contrast. RADIATION DOSE REDUCTION: This exam was performed according to the departmental dose-optimization program which includes automated exposure control, adjustment of the mA and/or kV according to patient size and/or use of iterative reconstruction technique. COMPARISON:  CT brain 10/10/2021 FINDINGS: Brain: No hemorrhage or intracranial mass. Patchy white matter hypodensities consistent with chronic small vessel ischemic change. Hypodensity within the right internal capsule is new compared to prior and is consistent with infarct of uncertain age. The ventricles are nonenlarged. Vascular: No hyperdense vessels.  No unexpected calcification Skull: Normal. Negative for fracture or focal lesion. Sinuses/Orbits: No acute finding. Other: None IMPRESSION: 1. Negative for hemorrhage or mass 2. Hypodensity within the right internal capsule is new compared to prior head CT from June and consistent with small infarct of uncertain age, this could be correlated with MRI as indicated. 3. Elsewhere no significant change in chronic small vessel ischemic disease Electronically Signed   By: Jasmine Pang M.D.   On: 01/02/2022 22:00    Cardiac Studies   Echo pending  Patient Profile     61 y.o. male with hypertension, diabetes presenting with shortness of breath and edema, being seen for acute CHF.  Also found to have acute nonhemorrhagic infarct in the right internal capsule.  Assessment & Plan    Acute heart failure symptoms -Still volume overloaded although improving. -Net -1.8 L over the past 24 hours, creatinine normal -Recommend increasing IV Lasix to 40 mg 3 times daily. -Echo was ordered, currently pending -Further  recommendations pending echo findings.   2.  Hypertension -BP improved, still elevated increase losartan to 5 mg daily -Lasix as above -Consider permissive hypertension due to acute stroke.   3.  Acute stroke in right internal capsule -Aspirin 81, Lipitor 40 -Management as per neurology, medicine teams   Total encounter time more than 50 minutes  Greater than 50% was spent in counseling and coordination of care with the patient       Signed, Debbe Odea, MD  01/04/2022, 1:44 PM

## 2022-01-05 ENCOUNTER — Inpatient Hospital Stay (HOSPITAL_COMMUNITY)
Admit: 2022-01-05 | Discharge: 2022-01-05 | Disposition: A | Discharge status: Home / Self Care | Payer: BC Managed Care – PPO | Attending: Cardiology | Admitting: Cardiology

## 2022-01-05 ENCOUNTER — Telehealth: Payer: Self-pay | Admitting: Cardiology

## 2022-01-05 DIAGNOSIS — I639 Cerebral infarction, unspecified: Secondary | ICD-10-CM | POA: Diagnosis not present

## 2022-01-05 DIAGNOSIS — I10 Essential (primary) hypertension: Secondary | ICD-10-CM | POA: Diagnosis not present

## 2022-01-05 DIAGNOSIS — I5031 Acute diastolic (congestive) heart failure: Secondary | ICD-10-CM | POA: Diagnosis not present

## 2022-01-05 DIAGNOSIS — E119 Type 2 diabetes mellitus without complications: Secondary | ICD-10-CM | POA: Diagnosis not present

## 2022-01-05 LAB — ECHOCARDIOGRAM COMPLETE
AR max vel: 2.84 cm2
AV Area VTI: 3.68 cm2
AV Area mean vel: 2.7 cm2
AV Mean grad: 1 mmHg
AV Peak grad: 1.7 mmHg
Ao pk vel: 0.65 m/s
Area-P 1/2: 5.09 cm2
Height: 75 in
S' Lateral: 3.6 cm
Weight: 4971.2 oz

## 2022-01-05 LAB — GLUCOSE, CAPILLARY
Glucose-Capillary: 130 mg/dL — ABNORMAL HIGH (ref 70–99)
Glucose-Capillary: 183 mg/dL — ABNORMAL HIGH (ref 70–99)

## 2022-01-05 MED ORDER — ASPIRIN 81 MG PO CHEW: 81.0000 mg | CHEWABLE_TABLET | Freq: Every day | ORAL | 3 refills | Status: AC

## 2022-01-05 MED ORDER — FUROSEMIDE 20 MG PO TABS: 40.0000 mg | ORAL_TABLET | Freq: Every day | ORAL | 2 refills | Status: AC

## 2022-01-05 MED ORDER — CLOPIDOGREL BISULFATE 75 MG PO TABS
75.0000 mg | ORAL_TABLET | Freq: Every day | ORAL | Status: DC
  Administered 2022-01-05: 75 mg via ORAL
  Filled 2022-01-05: qty 1

## 2022-01-05 MED ORDER — BLOOD GLUCOSE METER KIT: PACK | 3 refills | Status: AC

## 2022-01-05 MED ORDER — ATORVASTATIN CALCIUM 40 MG PO TABS: 40.0000 mg | ORAL_TABLET | Freq: Every day | ORAL | 3 refills | Status: AC

## 2022-01-05 MED ORDER — "PEN NEEDLES 3/16"" 31G X 5 MM MISC": 1.0000 | Freq: Four times a day (QID) | 3 refills | Status: AC

## 2022-01-05 MED ORDER — LOSARTAN POTASSIUM 50 MG PO TABS
50.0000 mg | ORAL_TABLET | Freq: Every day | ORAL | 3 refills | Status: AC
Start: 2022-01-06 — End: ?

## 2022-01-05 MED ORDER — CLOPIDOGREL BISULFATE 75 MG PO TABS: 75.0000 mg | ORAL_TABLET | Freq: Every day | ORAL | 0 refills | Status: AC

## 2022-01-05 MED ORDER — LANTUS SOLOSTAR 100 UNIT/ML ~~LOC~~ SOPN: 40.0000 [IU] | PEN_INJECTOR | Freq: Two times a day (BID) | SUBCUTANEOUS | 11 refills | Status: AC

## 2022-01-05 NOTE — Telephone Encounter (Signed)
Pt added to schedule for TOC appt on 01/14/22. Please advise

## 2022-01-05 NOTE — Plan of Care (Signed)
Neurology plan of care  Please see neurology consult from Dr. Wadie Lessen dated 8/26.  Patient presented with subacute ischemic infarct.  His stroke work-up is now completed.  TTE showed normal LVEF, no intracardiac clot, no other significant abnormalities.  No further inpatient neurology work-up is indicated.  Final recommendations: - ASA 81mg  daily + plavix 75mg  daily x21 days f/b ASA 81mg  daily monotherapy after that - Atorvastatin 40mg  daily - I will arrange outpatient neurology f/u with Dr. at [facility]  Neurology to sign off.  Please reengage if additional neurologic concerns arise.  , MD [facility] [phone]  If 7pm- 7am, please page neurology on call as listed in AMION.

## 2022-01-05 NOTE — Inpatient Diabetes Management (Signed)
Inpatient Diabetes Program Recommendations  AACE/ADA: New Consensus Statement on Inpatient Glycemic Control (2015)  Target Ranges:  Prepandial:   less than 140 mg/dL      Peak postprandial:   less than 180 mg/dL (1-2 hours)      Critically ill patients:  140 - 180 mg/dL    Latest Reference Range & Units 01/04/22 08:08 01/04/22 11:52 01/04/22 16:35 01/04/22 21:24  Glucose-Capillary 70 - 99 mg/dL 127 (H) 165 (H) 104 (H) 106 (H)  (H): Data is abnormally high  Latest Reference Range & Units 01/05/22 08:36 01/05/22 11:13  Glucose-Capillary 70 - 99 mg/dL 130 (H) 183 (H)  (H): Data is abnormally high   Latest Reference Range & Units 01/03/22 02:12  Hemoglobin A1C 4.8 - 5.6 % 8.2 (H)  (H): Data is abnormally high    Admit with: Acute CHF, MI, CVA  History: DM2  Home DM Meds: Lantus 42 units BID (NOT taking)  Current Orders: Semglee 20 units BID      Novolog Sensitive Correction Scale/ SSI (0-9 units) TID AC + HS   Lantus Insulin Pen- Order Number [id number]  Insulin Pen Needles- Order Number [id number]  CBG Meter Kit- Order Number [id number]   Discussed pt w/ Dr. Posey Pronto.  Decision made to send pt home on Lantus 40 units Daily and have pt follow up with PCP (pt just recently moved to [facility] and will need to find PCP after d/c--Note [facility] team met w/ pt to provide List of possible PCP choices based on pt's insurance).  Pt told me when he was using Lantus insulin with vial and syringe--would prefer insulin pens as pt has practice using Trulicity (stopped Trulicity about 6 months ago).  Has CBG meter at home but needs refills on Lancets and Strips.  Met w/ pt at bedside.  Discussed A1C results with him and explained what an A1C is, basic pathophysiology of DM Type 2, basic home care, importance of checking CBGs and maintaining good CBG control to prevent long-term and short-term complications.  Reviewed signs and symptoms of hyperglycemia and hypoglycemia and how to treat hypoglycemia at home.  Also  reviewed blood sugar goals and A1c goals for home.   Reviewed all steps of insulin pen including attachment of needle, 2-unit air shot, dialing up dose, giving injection, rotation of injection sites, removing needle, disposal of sharps, storage of unused insulin, disposal of insulin etc.  Patient able to provide successful verbal review with me--Stated he feels comfortable with pens since he has familiarity with Trulicity pens.  Reviewed troubleshooting with insulin pen.  Told pt I will attach an insulin pen instructional video to his AVS via QR code so that he watch the video prior to his 1st injection at home.  Pt very appreciative of visit and did not have any further questions for me.    --Will follow patient during hospitalization--  Wyn Quaker RN, MSN, [facility] Coordinator Inpatient Glycemic Control Team Team Pager: [phone] (8a-5p)

## 2022-01-05 NOTE — Progress Notes (Signed)
Patient to discharge today, no PCP however patient does have [employer] PPO. CSW has printed list of in network primary care providers for patient to choose provider within his insurance and reach out to schedule.   No further discharge needs identified at this time.   [place], [place] [phone]

## 2022-01-05 NOTE — Discharge Summary (Signed)
Physician Discharge Summary   Patient: Edwin Edwards MRN: [id number] DOB: Apr 25, 1961  Admit date:     01/03/2022  Discharge date: 01/05/22  Discharge Physician: Fritzi Mandes   PCP: Patient, No Pcp Per   Recommendations at discharge:   #patient advised to establish PCP in the area from a list of primary care providers in network for his insurance provided  #keep log of sugars at home keep log of blood pressure at home  #follow-up [facility] cardiology in 1 to 2 weeks  Discharge Diagnoses: acute congestive heart failure, diastolic acute CVA right internal capsule  Hospital Course:  Edwin Edwards is a 61 y.o. male with medical history significant of HTN, DM, who presents with SOB and leg edema.  Patient states that he has SOB for more than 4 days, which has been progressively worsening. Patient has bilateral leg edema and more than 10 pounds of weight gain recently.  No nausea, vomiting, diarrhea or abdominal pain.  No symptoms of UTI.  Patient also reports headache, which has been well for more than a week.    MRI-Brain 1. 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 2. Scattered subcortical T2 hyperintensities bilaterally are mildly advanced for age. The finding is nonspecific but can be seen in the setting of chronic microvascular ischemia, a demyelinating process such as multiple sclerosis, vasculitis, complicated migraine headaches, or as the sequelae of a prior infectious or inflammatory process.   CXR Mild central pulmonary vascular engorgement. No overt pulmonary edema.   Acute CHF (congestive heart failure) (HCC)--new, Diastolic Patient has 2+ leg edema, elevated BNP 140, weight gain, crackles on auscultation, clinically consistent with acute CHF. --  No 2D echo on record, not sure which type of CHF.  Patient has long history of hypertension, indicating possible diastolic CHF.  -- Consulted Dr. Garen Lah  of cardiology.  -Lasix 40 mg  tid by IV--diuresed well. Change to po lasix 40 mg qd -2d echo shows EF 50-55% -Daily weights -strict I/O's -Low salt diet -- added Coreg and Cozaar -- okay to go home from cardiology standpoint   Myocardial injury/demand ischemia due to acute CHF and stroke: Troponin level 32 --> 27.  No active chest pain. -Aspirin -Lipitor 40 mg daily   Stroke [facility]), acute, right IC Patient reports headache, MRI of the brain showed acute stroke.  No focal neurodeficit on physical examination.  -- Consulted Dr. Cheral Marker of neurology.  - Allow permissive HTN in the setting of acute stroke, for SBP>185 (not SBP > 220 due to acute CHF) - carotid dopplers --no stenosis -  ASA + plavix for 21 days and then drop plavix and cont ASA --cont lipitor -- PT/OT consult-- no PT needs -- on Coreg and Cozaar -- stroke workup completed. Okay with neurology for discharge   Diabetes mellitus without complication ([place]) No M0N on record, blood sugar 220.  Patient is supposed to take Lantus 42 units twice daily (currently not taking) -Glargine insulin 20 units twice daily--change to lantus 40 units qd -Sliding scale insulin -Follow-up A1c 8.6 -- patient to keep log of sugars at home discussed with primary care physician and will defer adding PO meds as outpatient.   Hypertension -IV hydralazine as needed for SBP> 185 --losartan and Coreg   Obesity, Class III, BMI 40-49.9 (morbid obesity) (HCC)  BMI= 42.50   and BW= 154.2 -Diet and exercise.   -Encourage to lose weight. -- Patient got education by dietitian  Overall hemodynamically stable will discharged to  home. Patient in agreement     Family communication : none today Consults :Neurology, Cardiology CODE STATUS: Full DVT Prophylaxis : lovenox     Disposition: Home Diet recommendation:  Discharge Diet Orders (From admission, onward)     Start     Ordered   01/05/22 0000  Diet - low sodium heart healthy        01/05/22 1400   01/05/22 0000  Diet  Carb Modified        01/05/22 1400           Cardiac and Carb modified diet DISCHARGE MEDICATION: Allergies as of 01/05/2022   No Known Allergies      Medication List     STOP taking these medications    Lantus 100 UNIT/ML injection Generic drug: insulin glargine Replaced by: Lantus SoloStar 100 UNIT/ML Solostar Pen   magic mouthwash w/lidocaine Soln       TAKE these medications    aspirin 81 MG chewable tablet Chew 1 tablet (81 mg total) by mouth daily. Start taking on: January 06, 2022   atorvastatin 40 MG tablet Commonly known as: LIPITOR Take 1 tablet (40 mg total) by mouth daily. Start taking on: January 06, 2022   blood glucose meter kit and supplies Dispense based on patient and insurance preference. Use up to four times daily as directed. (FOR ICD-10 E10.9, E11.9).   clopidogrel 75 MG tablet Commonly known as: PLAVIX Take 1 tablet (75 mg total) by mouth daily. Daily for 21 days only Start taking on: January 06, 2022   furosemide 20 MG tablet Commonly known as: Lasix Take 2 tablets (40 mg total) by mouth daily. What changed: how much to take   HYDROcodone-acetaminophen 5-325 MG tablet Commonly known as: NORCO/VICODIN Take 2 tablets by mouth every 6 (six) hours as needed for moderate pain or severe pain.   Lantus SoloStar 100 UNIT/ML Solostar Pen Generic drug: insulin glargine Inject 40 Units into the skin 2 (two) times daily. Replaces: Lantus 100 UNIT/ML injection   losartan 50 MG tablet Commonly known as: COZAAR Take 1 tablet (50 mg total) by mouth daily. Start taking on: January 06, 2022   Pen Needles 3/16" 31G X 5 MM Misc 1 Needle by Does not apply route 4 (four) times daily.        Follow-up Information     Kate Sable, MD. Schedule an appointment as soon as possible for a visit in 1 week(s).   Specialties: Cardiology, Radiology Contact information: [place] [address] [phone]                 Discharge Exam: Edwin Edwards Weights   01/02/22 2126 01/04/22 2126  Weight: (!) 154.2 kg (!) 140.9 kg     Condition at discharge: fair  The results of significant diagnostics from this hospitalization (including imaging, microbiology, ancillary and laboratory) are listed below for reference.   Imaging Studies: ECHOCARDIOGRAM COMPLETE  Result Date: 01/05/2022    ECHOCARDIOGRAM REPORT   Patient Name:   RIAN BUSCHE Date of Exam: 01/05/2022 Medical Rec #:  [id number]       Height:       75.0 in Accession #:    [id number]      Weight:       310.7 lb Date of Birth:  09/06/1960       BSA:          2.646 m Patient Age:    61 years  BP:           156/103 mmHg Patient Gender: M               HR:           84 bpm. Exam Location:  [facility] Procedure: 2D Echo, Cardiac Doppler and Color Doppler Indications:     CHF--acute diastolic V78.58  History:         Patient has no prior history of Echocardiogram examinations.                  Risk Factors:Diabetes and Hypertension.  Sonographer:     Sherrie Sport Referring Phys:  [id number] Kate Sable Diagnosing Phys: Kathlyn Sacramento MD  Sonographer Comments: Suboptimal apical window. IMPRESSIONS  1. Left ventricular ejection fraction, by estimation, is 50 to 55%. The left ventricle has low normal function. The left ventricle has no regional wall motion abnormalities. There is mild left ventricular hypertrophy. Left ventricular diastolic parameters are indeterminate.  2. Right ventricular systolic function is normal. The right ventricular size is normal. Tricuspid regurgitation signal is inadequate for assessing PA pressure.  3. Left atrial size was mildly dilated.  4. The mitral valve is normal in structure. No evidence of mitral valve regurgitation. No evidence of mitral stenosis.  5. The aortic valve is normal in structure. Aortic valve regurgitation is not visualized. No aortic stenosis is present. FINDINGS  Left Ventricle: Left ventricular ejection fraction, by  estimation, is 50 to 55%. The left ventricle has low normal function. The left ventricle has no regional wall motion abnormalities. The left ventricular internal cavity size was normal in size. There is mild left ventricular hypertrophy. Left ventricular diastolic parameters are indeterminate. Right Ventricle: The right ventricular size is normal. No increase in right ventricular wall thickness. Right ventricular systolic function is normal. Tricuspid regurgitation signal is inadequate for assessing PA pressure. Left Atrium: Left atrial size was mildly dilated. Right Atrium: Right atrial size was normal in size. Pericardium: There is no evidence of pericardial effusion. Mitral Valve: The mitral valve is normal in structure. No evidence of mitral valve regurgitation. No evidence of mitral valve stenosis. Tricuspid Valve: The tricuspid valve is normal in structure. Tricuspid valve regurgitation is not demonstrated. No evidence of tricuspid stenosis. Aortic Valve: The aortic valve is normal in structure. Aortic valve regurgitation is not visualized. No aortic stenosis is present. Aortic valve mean gradient measures 1.0 mmHg. Aortic valve peak gradient measures 1.7 mmHg. Aortic valve area, by VTI measures 3.68 cm. Pulmonic Valve: The pulmonic valve was normal in structure. Pulmonic valve regurgitation is not visualized. No evidence of pulmonic stenosis. Aorta: The aortic root is normal in size and structure. Venous: The inferior vena cava was not well visualized. IAS/Shunts: No atrial level shunt detected by color flow Doppler.  LEFT VENTRICLE PLAX 2D LVIDd:         5.10 cm   Diastology LVIDs:         3.60 cm   LV e' medial:    5.00 cm/s LV PW:         1.20 cm   LV E/e' medial:  11.2 LV IVS:        1.20 cm   LV e' lateral:   5.00 cm/s LVOT diam:     2.10 cm   LV E/e' lateral: 11.2 LV SV:         42 LV SV Index:   16 LVOT Area:     3.46 cm  RIGHT VENTRICLE  RV Basal diam:  4.10 cm RV S prime:     13.70 cm/s TAPSE  (M-mode): 2.7 cm LEFT ATRIUM             Index        RIGHT ATRIUM           Index LA diam:        3.40 cm 1.28 cm/m   RA Area:     21.00 cm LA Vol (A2C):   89.6 ml 33.86 ml/m  RA Volume:   60.00 ml  22.67 ml/m LA Vol (A4C):   59.0 ml 22.30 ml/m LA Biplane Vol: 81.1 ml 30.65 ml/m  AORTIC VALVE AV Area (Vmax):    2.84 cm AV Area (Vmean):   2.70 cm AV Area (VTI):     3.68 cm AV Vmax:           64.60 cm/s AV Vmean:          49.700 cm/s AV VTI:            0.113 m AV Peak Grad:      1.7 mmHg AV Mean Grad:      1.0 mmHg LVOT Vmax:         52.90 cm/s LVOT Vmean:        38.800 cm/s LVOT VTI:          0.120 m LVOT/AV VTI ratio: 1.06  AORTA Ao Root diam: 3.70 cm MITRAL VALVE               TRICUSPID VALVE MV Area (PHT): 5.09 cm    TR Peak grad:   8.0 mmHg MV Decel Time: 149 msec    TR Vmax:        141.00 cm/s MV E velocity: 56.10 cm/s MV A velocity: 56.60 cm/s  SHUNTS MV E/A ratio:  0.99        Systemic VTI:  0.12 m                            Systemic Diam: 2.10 cm Kathlyn Sacramento MD Electronically signed by Kathlyn Sacramento MD Signature Date/Time: 01/05/2022/9:23:14 AM    Final    US Carotid Bilateral (at [facility] and [facility] only)  Result Date: 01/03/2022 CLINICAL DATA:  Stroke EXAM: BILATERAL CAROTID DUPLEX ULTRASOUND TECHNIQUE: Pearline Cables scale imaging, color Doppler and duplex ultrasound were performed of bilateral carotid and vertebral arteries in the neck. COMPARISON:  None Available. FINDINGS: Criteria: Quantification of carotid stenosis is based on velocity parameters that correlate the residual internal carotid diameter with NASCET-based stenosis levels, using the diameter of the distal internal carotid lumen as the denominator for stenosis measurement. The following velocity measurements were obtained: RIGHT ICA: 62 cm/sec CCA: 70 cm/sec SYSTOLIC ICA/CCA RATIO:  0.9 ECA: 60 cm/sec LEFT ICA: 76 cm/sec CCA: 75 cm/sec SYSTOLIC ICA/CCA RATIO:  1.0 ECA: 55 cm/sec RIGHT CAROTID ARTERY: No significant atherosclerotic disease  visualized on grayscale imaging. RIGHT VERTEBRAL ARTERY:  Antegrade LEFT CAROTID ARTERY: No significant atherosclerotic disease visualized on grayscale imaging. LEFT VERTEBRAL ARTERY:  Antegrade IMPRESSION: No evidence of flow limiting stenosis. Electronically Signed   By: Yetta Glassman M.D.   On: 01/03/2022 18:39   MR ANGIO HEAD WO CONTRAST  Result Date: 01/03/2022 CLINICAL DATA:  Right internal capsule acute infarct. EXAM: MRA HEAD WITHOUT CONTRAST TECHNIQUE: Angiographic images of the Circle of Willis were acquired using MRA technique without intravenous contrast. COMPARISON:  MR head without contrast 01/03/2022  FINDINGS: Anterior circulation: Internal carotid arteries are within normal limits high cervical segments through the ICA termini. The A1 and M1 segments are normal. Anterior communicating artery is patent. ACA and MCA branch vessels are within normal limits. Posterior circulation: The vertebral arteries are codominant. PICA origins are visualized. Vertebrobasilar junction artery are normal. Both posterior cerebral arteries originate from the basilar tip. Post median artery contributes. PCA branch vessels within normal limits bilaterally. Anatomic variants: None IMPRESSION: Normal MRA circle-of-Willis. No significant proximal stenosis, aneurysm, or branch vessel occlusion. Electronically Signed   By: San Morelle M.D.   On: 01/03/2022 16:11   MR BRAIN WO CONTRAST  Result Date: 01/03/2022 CLINICAL DATA:  Headache, new or worsening. Progressive shortness of breath. EXAM: MRI HEAD WITHOUT CONTRAST TECHNIQUE: Multiplanar, multiecho pulse sequences of the brain and surrounding structures were obtained without intravenous contrast. COMPARISON:  A 9 mm acute scratched at CT head without contrast 01/02/22 FINDINGS: Brain: Acute nonhemorrhagic infarct is confirmed within the genu of the right internal capsule. Infarct measures 12 mm maximally on coronal images. T2 and FLAIR hyperintensities  associated. No acute hemorrhage is scratched at no cc hemorrhage is present. Scattered subcortical T2 hyperintensities bilaterally are mildly advanced for age. No additional acute infarcts are present. The ventricles are of normal size. No significant extraaxial fluid collection is present. The brainstem and cerebellum are within normal limits. Vascular: Flow is present in the major intracranial arteries. Skull and upper cervical spine: The craniocervical junction is normal. Upper cervical spine is within normal limits. Marrow signal is unremarkable. Sinuses/Orbits: The paranasal sinuses and mastoid air cells are clear. The globes and orbits are within normal limits. IMPRESSION: 1. 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 2. Scattered subcortical T2 hyperintensities bilaterally are mildly advanced for age. The finding is nonspecific but can be seen in the setting of chronic microvascular ischemia, a demyelinating process such as multiple sclerosis, vasculitis, complicated migraine headaches, or as the sequelae of a prior infectious or inflammatory process. These results will be called to the ordering clinician or representative by the Radiologist Assistant, and communication documented in the PACS or [employer]. Electronically Signed   By: San Morelle M.D.   On: 01/03/2022 14:48   DG Chest 2 View  Result Date: 01/02/2022 CLINICAL DATA:  Dyspnea EXAM: CHEST - 2 VIEW COMPARISON:  None Available. FINDINGS: The lungs are symmetrically well expanded. Benign calcified granuloma within the right mid lung zone. Lungs are clear. No pneumothorax or pleural effusion. Cardiac size is within normal limits. There is mild central pulmonary vascular engorgement, similar to prior examination, without superimposed overt pulmonary edema. IMPRESSION: Mild central pulmonary vascular engorgement. No overt pulmonary edema. Electronically Signed   By: Fidela Salisbury M.D.   On:  01/02/2022 22:21   CT Head Wo Contrast  Result Date: 01/02/2022 CLINICAL DATA:  Dizziness headache EXAM: CT HEAD WITHOUT CONTRAST TECHNIQUE: Contiguous axial images were obtained from the base of the skull through the vertex without intravenous contrast. RADIATION DOSE REDUCTION: This exam was performed according to the departmental dose-optimization program which includes automated exposure control, adjustment of the mA and/or kV according to patient size and/or use of iterative reconstruction technique. COMPARISON:  CT brain 10/10/2021 FINDINGS: Brain: No hemorrhage or intracranial mass. Patchy white matter hypodensities consistent with chronic small vessel ischemic change. Hypodensity within the right internal capsule is new compared to prior and is consistent with infarct of uncertain age. The ventricles are nonenlarged. Vascular: No hyperdense vessels.  No unexpected calcification Skull: Normal. Negative for fracture or focal lesion. Sinuses/Orbits: No acute finding. Other: None IMPRESSION: 1. Negative for hemorrhage or mass 2. Hypodensity within the right internal capsule is new compared to prior head CT from June and consistent with small infarct of uncertain age, this could be correlated with MRI as indicated. 3. Elsewhere no significant change in chronic small vessel ischemic disease Electronically Signed   By: Donavan Foil M.D.   On: 01/02/2022 22:00   CT Angio Chest PE W and/or Wo Contrast  Result Date: 01/01/2022 CLINICAL DATA:  Chest pain or shortness of breath, pleurisy or effusion suspected. Shortness of breath for 4 days. EXAM: CT ANGIOGRAPHY CHEST WITH CONTRAST TECHNIQUE: Multidetector CT imaging of the chest was performed using the standard protocol during bolus administration of intravenous contrast. Multiplanar CT image reconstructions and MIPs were obtained to evaluate the vascular anatomy. RADIATION DOSE REDUCTION: This exam was performed according to the departmental dose-optimization  program which includes automated exposure control, adjustment of the mA and/or kV according to patient size and/or use of iterative reconstruction technique. CONTRAST:  153m OMNIPAQUE IOHEXOL 350 MG/ML SOLN COMPARISON:  Chest radiographs today and 10/10/2021. FINDINGS: Cardiovascular: The pulmonary arteries are well opacified with contrast to the level of the subsegmental branches. There is no evidence of acute pulmonary embolism. No significant systemic arterial abnormalities are identified. The heart size is normal. There is no pericardial effusion. Mediastinum/Nodes: There are no enlarged mediastinal, hilar or axillary lymph nodes.Calcified subcarinal nodes are likely postinflammatory. The thyroid gland, trachea and esophagus demonstrate no significant findings. Lungs/Pleura: There are small bilateral pleural effusions with associated atelectasis at both lung bases. There is a calcified granuloma in the right middle lobe. No pneumothorax or suspicious pulmonary nodule. Generalized pulmonary interstitial prominence which may reflect edema. Upper abdomen:  The visualized upper abdomen appears unremarkable. Musculoskeletal/Chest wall: There is no chest wall mass or suspicious osseous finding. No evidence of rib fracture. Review of the MIP images confirms the above findings. IMPRESSION: 1. No evidence of acute pulmonary embolism or other acute vascular findings in the chest. 2. Sequela of prior granulomatous disease. 3. Pulmonary interstitial prominence and bilateral pleural effusions suggesting congestive heart failure. Electronically Signed   By: WRichardean SaleM.D.   On: 01/01/2022 17:46   DG Chest 2 View  Result Date: 01/01/2022 CLINICAL DATA:  Shortness of breath EXAM: CHEST - 2 VIEW COMPARISON:  Previous studies including the examination of 10/10/2021 FINDINGS: Transverse diameter of heart is increased. Central pulmonary vessels are more prominent. There is interval increase in interstitial markings in  the parahilar regions and lower lung fields. Small bilateral pleural effusions are seen. There is no pneumothorax. IMPRESSION: Cardiomegaly. Central pulmonary vessels are more prominent. Increased interstitial markings are seen in the parahilar regions and lower lung fields. Findings suggest CHF. Small bilateral pleural effusions are seen. Electronically Signed   By: PElmer PickerM.D.   On: 01/01/2022 13:37    Microbiology: Results for orders placed or performed during the hospital encounter of 01/01/22  Resp Panel by RT-PCR (Flu A&B, Covid) Anterior Nasal Swab     Status: None   Collection Time: 01/01/22  5:20 PM   Specimen: Anterior Nasal Swab  Result Value Ref Range Status   SARS Coronavirus 2 by RT PCR NEGATIVE NEGATIVE Final    Comment: (NOTE) SARS-CoV-2 target nucleic acids are NOT DETECTED.  The SARS-CoV-2 RNA is generally detectable in upper respiratory specimens during the acute phase of infection. The lowest concentration  of SARS-CoV-2 viral copies this assay can detect is 138 copies/mL. A negative result does not preclude SARS-Cov-2 infection and should not be used as the sole basis for treatment or other patient management decisions. A negative result may occur with  improper specimen collection/handling, submission of specimen other than nasopharyngeal swab, presence of viral mutation(s) within the areas targeted by this assay, and inadequate number of viral copies(<138 copies/mL). A negative result must be combined with clinical observations, patient history, and epidemiological information. The expected result is Negative.  Fact Sheet for Patients:  EntrepreneurPulse.com.au  Fact Sheet for Healthcare Providers:  IncredibleEmployment.be  This test is no t yet approved or cleared by the [place] FDA and  has been authorized for detection and/or diagnosis of SARS-CoV-2 by FDA under an Emergency Use Authorization (EUA). This  EUA will remain  in effect (meaning this test can be used) for the duration of the COVID-19 declaration under Section 564(b)(1) of the Act, 21 U.S.C.section 360bbb-3(b)(1), unless the authorization is terminated  or revoked sooner.       Influenza A by PCR NEGATIVE NEGATIVE Final   Influenza B by PCR NEGATIVE NEGATIVE Final    Comment: (NOTE) The Xpert Xpress SARS-CoV-2/FLU/RSV plus assay is intended as an aid in the diagnosis of influenza from Nasopharyngeal swab specimens and should not be used as a sole basis for treatment. Nasal washings and aspirates are unacceptable for Xpert Xpress SARS-CoV-2/FLU/RSV testing.  Fact Sheet for Patients: EntrepreneurPulse.com.au  Fact Sheet for Healthcare Providers: IncredibleEmployment.be  This test is not yet approved or cleared by the [place] FDA and has been authorized for detection and/or diagnosis of SARS-CoV-2 by FDA under an Emergency Use Authorization (EUA). This EUA will remain in effect (meaning this test can be used) for the duration of the COVID-19 declaration under Section 564(b)(1) of the Act, 21 U.S.C. section 360bbb-3(b)(1), unless the authorization is terminated or revoked.  Performed at [facility], [address] [place], [place] [address]     Labs: CBC: Recent Labs  Lab 01/01/22 1327 01/02/22 2134  WBC 4.2 3.8*  NEUTROABS 2.6 2.0  HGB 13.1 13.5  HCT 42.2 43.5  MCV 79.3* 79.5*  PLT 206 419   Basic Metabolic Panel: Recent Labs  Lab 01/01/22 1327 01/02/22 2134 01/04/22 0651  NA 141 139 140  K 4.0 3.7 3.7  CL 108 106 106  CO2 '23 24 27  ' GLUCOSE 118* 220* 121*  BUN '12 15 11  ' CREATININE 1.06 1.23 0.95  CALCIUM 9.0 9.0 8.8*  MG  --   --  2.0   Liver Function Tests: Recent Labs  Lab 01/02/22 2134  AST 33  ALT 27  ALKPHOS 68  BILITOT 1.0  PROT 7.5  ALBUMIN 3.8   CBG: Recent Labs  Lab 01/04/22 1152 01/04/22 1635 01/04/22 2124  01/05/22 0836 01/05/22 1113  GLUCAP 165* 104* 106* 130* 183*    Discharge time spent: greater than 30 minutes.  Signed: Fritzi Mandes, MD [facility] 01/05/2022

## 2022-01-05 NOTE — Discharge Instructions (Addendum)
Heart Healthy, Consistent Carbohydrate Nutrition Therapy   A heart-healthy and consistent carbohydrate diet is recommended to manage heart disease and diabetes. To follow a heart-healthy and consistent carbohydrate diet, Eat a balanced diet with whole grains, fruits and vegetables, and lean protein sources.  Choose heart-healthy unsaturated fats. Limit saturated fats, trans fats, and cholesterol intake. Eat more plant-based or vegetarian meals using beans and soy foods for protein.  Eat whole, unprocessed foods to limit the amount of sodium (salt) you eat.  Choose a consistent amount of carbohydrate at each meal and snack. Limit refined carbohydrates especially sugar, sweets and sugar-sweetened beverages.  If you drink alcohol, do so in moderation: one serving per day (women) and two servings per day (men). o One serving is equivalent to 12 ounces beer, 5 ounces wine, or 1.5 ounces distilled spirits  Tips Tips for Choosing Heart-Healthy Fats Choose lean protein and low-fat dairy foods to reduce saturated fat intake. Saturated fat is usually found in animal-based protein and is associated with certain health risks. Saturated fat is the biggest contributor to raise low-density lipoprotein (LDL) cholesterol levels. Research shows that limiting saturated fat lowers unhealthy cholesterol levels. Eat no more than 7% of your total calories each day from saturated fat. Ask your RDN to help you determine how much saturated fat is right for you. There are many foods that do not contain large amounts of saturated fats. Swapping these foods to replace foods high in saturated fats will help you limit the saturated fat you eat and improve your cholesterol levels. You can also try eating more plant-based or vegetarian meals. Instead of. Try:  Whole milk, cheese, yogurt, and ice cream 1% or skim milk, low-fat cheese, non-fat yogurt, and low-fat ice cream  Fatty, marbled beef and pork Lean beef, pork, or venison   Poultry with skin Poultry without skin  Butter, stick margarine Reduced-fat, whipped, or liquid spreads  Coconut oil, palm oil Liquid vegetable oils: corn, canola, olive, soybean and safflower oils   Avoid foods that contain trans fats. Trans fats increase levels of LDL-cholesterol. Hydrogenated fat in processed foods is the main source of trans fats in foods.  Trans fats can be found in stick margarine, shortening, processed sweets, baked goods, some fried foods, and packaged foods made with hydrogenated oils. Avoid foods with "partially hydrogenated oil" on the ingredient list such as: cookies, pastries, baked goods, biscuits, crackers, microwave popcorn, and frozen dinners. Choose foods with heart healthy fats. Polyunsaturated and monounsaturated fat are unsaturated fats that may help lower your blood cholesterol level when used in place of saturated fat in your diet. Ask your RDN about taking a dietary supplement with plant sterols and stanols to help lower your cholesterol level. Research shows that substituting saturated fats with unsaturated fats is beneficial to cholesterol levels. Try these easy swaps: Instead of. Try:  Butter, stick margarine, or solid shortening Reduced-fat, whipped, or liquid spreads  Beef, pork, or poultry with skin Fish and seafood  Chips, crackers, snack foods Raw or unsalted nuts and seeds or nut butters Hummus with vegetables Avocado on toast  Coconut oil, palm oil Liquid vegetable oils: corn, canola, olive, soybean and safflower oils  Limit the amount of cholesterol you eat to less than 200 milligrams per day. Cholesterol is a substance carried through the bloodstream via lipoproteins, which are known as "transporters" of fat. Some body functions need cholesterol to work properly, but too much cholesterol in the bloodstream can damage arteries and build up blood vessel linings (  which can lead to heart attack and stroke). You should eat less than 200 milligrams  cholesterol per day. People respond differently to eating cholesterol. There is no test available right now that can figure out which people will respond more to dietary cholesterol and which will respond less. For individuals with high intake of dietary cholesterol, different types of increase (none, small, moderate, large) in LDL-cholesterol levels are all possible.  Food sources of cholesterol include egg yolks and organ meats such as liver, gizzards. Limit egg yolks to two to four per week and avoid organ meats like liver and gizzards to control cholesterol intake. Tips for Choosing Heart-Healthy Carbohydrates Consume a consistent amount of carbohydrate It is important to eat foods with carbohydrates in moderation because they impact your blood glucose level. Carbohydrates can be found in many foods such as: Grains (breads, crackers, rice, pasta, and cereals)  Starchy Vegetables (potatoes, corn, and peas)  Beans and legumes  Milk, soy milk, and yogurt  Fruit and fruit juice  Sweets (cakes, cookies, ice cream, jam and jelly) Your RDN will help you set a goal for how many carbohydrate servings to eat at your meals and snacks. For many adults, eating 3 to 5 servings of carbohydrate foods at each meal and 1 or 2 carbohydrate servings for each snack works well.  Check your blood glucose level regularly. It can tell you if you need to adjust when you eat carbohydrates. Choose foods rich in viscous (soluble) fiber Viscous, or soluble, is found in the walls of plant cells. Viscous fiber is found only in plant-based foods. Eating foods with fiber helps to lower your unhealthy cholesterol and keep your blood glucose in range  Rich sources of viscous fiber include vegetables (asparagus, Brussels sprouts, sweet potatoes, turnips) fruit (apricots, mangoes, oranges), legumes, and whole grains (barley, oats, and oat bran).  As you increase your fiber intake gradually, also increase the amount of water you  drink. This will help prevent constipation.  If you have difficulty achieving this goal, ask your RDN about fiber laxatives. Choose fiber supplements made with viscous fibers such as psyllium seed husks or methylcellulose to help lower unhealthy cholesterol.  Limit refined carbohydrates  There are three types of carbohydrates: starches, sugar, and fiber. Some carbohydrates occur naturally in food, like the starches in rice or corn or the sugars in fruits and milk. Refined carbohydrates--foods with high amounts of simple sugars--can raise triglyceride levels. High triglyceride levels are associated with coronary heart disease. Some examples of refined carbohydrate foods are table sugar, sweets, and beverages sweetened with added sugar. Tips for Reducing Sodium (Salt) Although sodium is important for your body to function, too much sodium can be harmful for people with high blood pressure. As sodium and fluid buildup in your tissues and bloodstream, your blood pressure increases. High blood pressure may cause damage to other organs and increase your risk for a stroke. Even if you take a pill for blood pressure or a water pill (diuretic) to remove fluid, it is still important to have less salt in your diet. Ask your doctor and RDN what amount of sodium is right for you. Avoid processed foods. Eat more fresh foods.  Fresh fruits and vegetables are naturally low in sodium, as well as frozen vegetables and fruits that have no added juices or sauces.  Fresh meats are lower in sodium than processed meats, such as bacon, sausage, and hotdogs. Read the nutrition label or ask your butcher to help you find a   fresh meat that is low in sodium. Eat less salt--at the table and when cooking.  A single teaspoon of table salt has 2,300 mg of sodium.  Leave the salt out of recipes for pasta, casseroles, and soups.  Ask your RDN how to cook your favorite recipes without sodium Be a smart shopper.  Look for food packages  that say "salt-free" or "sodium-free." These items contain less than 5 milligrams of sodium per serving.  "Very low-sodium" products contain less than 35 milligrams of sodium per serving.  "Low-sodium" products contain less than 140 milligrams of sodium per serving.  Beware for "Unsalted" or "No Added Salt" products. These items may still be high in sodium. Check the nutrition label. Add flavors to your food without adding sodium.  Try lemon juice, lime juice, fruit juice or vinegar.  Dry or fresh herbs add flavor. Try basil, bay leaf, dill, rosemary, parsley, sage, dry mustard, nutmeg, thyme, and paprika.  Pepper, red pepper flakes, and cayenne pepper can add spice t your meals without adding sodium. Hot sauce contains sodium, but if you use just a drop or two, it will not add up to much.  Buy a sodium-free seasoning blend or make your own at home. Additional Lifestyle Tips Achieve and maintain a healthy weight. Talk with your RDN or your doctor about what is a healthy weight for you. Set goals to reach and maintain that weight.  To lose weight, reduce your calorie intake along with increasing your physical activity. A weight loss of 10 to 15 pounds could reduce LDL-cholesterol by 5 milligrams per deciliter. Participate in physical activity. Talk with your health care team to find out what types of physical activity are best for you. Set a plan to get about 30 minutes of exercise on most days.  Foods Recommended Food Group Foods Recommended  Grains Whole grain breads and cereals, including whole wheat, barley, rye, buckwheat, corn, teff, quinoa, millet, amaranth, brown or wild rice, sorghum, and oats Pasta, especially whole wheat or other whole grain types  Brown rice, quinoa or wild rice Whole grain crackers, bread, rolls, pitas Home-made bread with reduced-sodium baking soda  Protein Foods Lean cuts of beef and pork (loin, leg, round, extra lean hamburger)  Skinless poultry Fish Venison  and other wild game Dried beans and peas Nuts and nut butters Meat alternatives made with soy or textured vegetable protein  Egg whites or egg substitute Cold cuts made with lean meat or soy protein  Dairy Nonfat (skim), low-fat, or 1%-fat milk  Nonfat or low-fat yogurt or cottage cheese Fat-free and low-fat cheese  Vegetables Fresh, frozen, or canned vegetables without added fat or salt   Fruits Fresh, frozen, canned, or dried fruit   Oils Unsaturated oils (corn, olive, peanut, soy, sunflower, canola)  Soft or liquid margarines and vegetable oil spreads  Salad dressings Seeds and nuts  Avocado   Foods Not Recommended Food Group Foods Not Recommended  Grains Breads or crackers topped with salt Cereals (hot or cold) with more than 300 mg sodium per serving Biscuits, cornbread, and other "quick" breads prepared with baking soda Bread crumbs or stuffing mix from a store High-fat bakery products, such as doughnuts, biscuits, croissants, danish pastries, pies, cookies Instant cooking foods to which you add hot water and stir--potatoes, noodles, rice, etc. Packaged starchy foods--seasoned noodle or rice dishes, stuffing mix, macaroni and cheese dinner Snacks made with partially hydrogenated oils, including chips, cheese puffs, snack mixes, regular crackers, butter-flavored popcorn  Protein Foods   Higher-fat cuts of meats (ribs, t-bone steak, regular hamburger) Bacon, sausage, or hot dogs Cold cuts, such as salami or bologna, deli meats, cured meats, corned beef Organ meats (liver, brains, gizzards, sweetbreads) Poultry with skin Fried or smoked meat, poultry, and fish Whole eggs and egg yolks (more than 2-4 per week) Salted legumes, nuts, seeds, or nut/seed butters Meat alternatives with high levels of sodium (>300 mg per serving) or saturated fat (>5 g per serving)  Dairy Whole milk,?2% fat milk, buttermilk Whole milk yogurt or ice cream Cream Half-&-half Cream cheese Sour  cream Cheese  Vegetables Canned or frozen vegetables with salt, fresh vegetables prepared with salt, butter, cheese, or cream sauce Fried vegetables Pickled vegetables such as olives, pickles, or sauerkraut  Fruits Fried fruits Fruits served with butter or cream  Oils Butter, stick margarine, shortening Partially hydrogenated oils or trans fats Tropical oils (coconut, palm, palm kernel oils)  Other Candy, sugar sweetened soft drinks and desserts Salt, sea salt, garlic salt, and seasoning mixes containing salt Bouillon cubes Ketchup, barbecue sauce, Worcestershire sauce, soy sauce, teriyaki sauce Miso Salsa Pickles, olives, relish   Heart Healthy Consistent Carbohydrate Vegetarian (Lacto-Ovo) Sample 1-Day Menu  Breakfast 1 cup oatmeal, cooked (2 carbohydrate servings)   cup blueberries (1 carbohydrate serving)  11 almonds, without salt  1 cup 1% milk (1 carbohydrate serving)  1 cup coffee  Morning Snack 1 cup fat-free plain yogurt (1 carbohydrate serving)  Lunch 1 whole wheat bun (1 carbohydrate servings)  1 black bean burger (1 carbohydrate servings)  1 slice cheddar cheese, low sodium  2 slices tomatoes  2 leaves lettuce  1 teaspoon mustard  1 small pear (1 carbohydrate servings)  1 cup green tea, unsweetened  Afternoon Snack 1/3 cup trail mix with nuts, seeds, and raisins, without salt (1 carbohydrate servinga)  Evening Meal  cup meatless chicken  2/3 cup brown rice, cooked (2 carbohydrate servings)  1 cup broccoli, cooked (2/3 carbohydrate serving)   cup carrots, cooked (1/3 carbohydrate serving)  2 teaspoons olive oil  1 teaspoon balsamic vinegar  1 whole wheat dinner roll (1 carbohydrate serving)  1 teaspoon margarine, soft, tub  1 cup 1% milk (1 carbohydrate serving)  Evening Snack 1 extra small banana (1 carbohydrate serving)  1 tablespoon peanut butter   Heart Healthy Consistent Carbohydrate Vegan Sample 1-Day Menu  Breakfast 1 cup oatmeal, cooked (2  carbohydrate servings)   cup blueberries (1 carbohydrate serving)  11 almonds, without salt  1 cup soymilk fortified with calcium, vitamin B12, and vitamin D  1 cup coffee  Morning Snack 6 ounces soy yogurt (1 carbohydrate servings)  Lunch 1 whole wheat bun(1 carbohydrate servings)  1 black bean burger (1 carbohydrate serving)  2 slices tomatoes  2 leaves lettuce  1 teaspoon mustard  1 small pear (1 carbohydrate servings)  1 cup green tea, unsweetened  Afternoon Snack 1/3 cup trail mix with nuts, seeds, and raisins, without salt (1 carbohydrate servings)  Evening Meal  cup meatless chicken  2/3 cup brown rice, cooked (2 carbohydrate servings)  1 cup broccoli, cooked (2/3 carbohydrate serving)   cup carrots, cooked (1/3 carbohydrate serving)  2 teaspoons olive oil  1 teaspoon balsamic vinegar  1 whole wheat dinner roll (1 carbohydrate serving)  1 teaspoon margarine, soft, tub  1 cup soymilk fortified with calcium, vitamin B12, and vitamin D  Evening Snack 1 extra small banana (1 carbohydrate serving)  1 tablespoon peanut butter    Heart Healthy Consistent Carbohydrate Sample   1-Day Menu  Breakfast 1 cup cooked oatmeal (2 carbohydrate servings)  3/4 cup blueberries (1 carbohydrate serving)  1 ounce almonds  1 cup skim milk (1 carbohydrate serving)  1 cup coffee  Morning Snack 1 cup sugar-free nonfat yogurt (1 carbohydrate serving)  Lunch 2 slices whole-wheat bread (2 carbohydrate servings)  2 ounces lean [place] breast  1 ounce low-fat Swiss cheese  1 teaspoon mustard  1 slice tomato  1 lettuce leaf  1 small pear (1 carbohydrate serving)  1 cup skim milk (1 carbohydrate serving)  Afternoon Snack 1 ounce trail mix with unsalted nuts, seeds, and raisins (1 carbohydrate serving)  Evening Meal 3 ounces salmon  2/3 cup cooked brown rice (2 carbohydrate servings)  1 teaspoon soft margarine  1 cup cooked broccoli with 1/2 cup cooked carrots (1 carbohydrate serving  Carrots,  cooked, boiled, drained, without salt  1 cup lettuce  1 teaspoon olive oil with vinegar for dressing  1 small whole grain roll (1 carbohydrate serving)  1 teaspoon soft margarine  1 cup unsweetened tea  Evening Snack 1 extra-small banana (1 carbohydrate serving)  Copyright 2020  Academy [employer]etics. All rights reserved.    Patient advised to establish primary care from the list provided by caseworker keep log of his sugars of blood pressure at home to discuss with primary care and cardiology

## 2022-01-05 NOTE — Progress Notes (Signed)
*  PRELIMINARY RESULTS* Echocardiogram 2D Echocardiogram has been performed.  Edwin Edwards 01/05/2022, 9:08 AM

## 2022-01-05 NOTE — Progress Notes (Signed)
OT Cancellation Note  Patient Details Name: Edwin Edwards MRN: [id number] DOB: [birth year]   Cancelled Treatment:    Reason Eval/Treat Not Completed: OT screened, no needs identified, will sign off. OT orders received, chart reviewed. Pt denies difficulty completing ADL tasks. Pt was dressed in personal clothes upon arrival. Pt denies acute changes in vision, sensation, and coordination. No further skilled OT services indicated as pt appears at his baseline. Pt agreed. NSG to reconsult if any change in status while admitted.  Gerrie Nordmann 01/05/2022, 9:39 AM

## 2022-01-05 NOTE — Progress Notes (Signed)
Mobility Specialist - Progress Note   01/05/22 1103  Mobility  Activity Ambulated independently in hallway;Stood at bedside;Dangled on edge of bed  Level of Assistance Independent  Assistive Device None  Distance Ambulated (ft) 160 ft  Activity Response Tolerated well  $Mobility charge 1 Mobility   Pt supine in bed on RA upon arrival. Pt STS and ambulates 1 lap around NS indep. Pt returns to bed with needs in reach.   Terrilyn Saver  Mobility Specialist  01/05/22 11:05 AM

## 2022-01-05 NOTE — Consult Note (Signed)
   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  Mild LVH.    He presented to the emergency room with increasing shortness of breath leg swelling and complaining of a headache for 1 week duration.  Comorbidities:  Hypertension Acute stroke Diabetes  Medications:  Aspirin 81 mg daily Atorvastatin 40 mg daily Plavix 75 mg daily Furosemide 40 mg IV 3 times a day Losartan 50 mg daily  Labs:  Sodium 140, potassium 3.7, chloride 106, CO2 27, BUN 11, creatinine 0.95, GFR greater than 60. Weight 140.9 kg Blood pressure 145/86 Intake not documented Output 2000 mL   Still meeting with patient on this admission.  Lying in bed in no acute distress currently on room air.  States that he is familiar with the term heart failure but not in relationship to himself.  Discussed what heart failure means.  Also discussed diastolic heart failure.  On diagram.  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  And that he usually eats lunch out.  Discussed making wise choices with the types of foods that he eats along with removing the saltshaker from the table.  Also went over daily weights, what to report, he states at this time he does not have a scale but he will go to [place] and get 1.  Discussed fluid restriction of 64 ounces and what constitutes a liquid.  Voices understanding.  He states that he will not have any problems with obtaining his medications nor problems with transportation.  Made aware that he has an appointment in the outpatient heart failure clinic on September 15 at 9:30 in the morning.  He had no further questions at this time.  Tresa Endo RN [facility]

## 2022-01-05 NOTE — Progress Notes (Signed)
Nutrition Education Note  RD consulted for nutrition education regarding CHF and diabetes.  Lab Results  Component Value Date   HGBA1C 8.2 (H) 01/03/2022    PTA DM medications are 42 units insulin glargine BID.   Labs reviewed: CBGS: 104-164 (inpatient orders for glycemic control are 0-5 units insulin aspart daily at bedtime, 0-9 units insulin aspart TID with meals, and 20 units insulin glargine-yfgn BID).    Spoke with pt over the phone, who reports feeling better today. He has a great appetite and consumed an omelette and cheese and veggies this morning. Pt typically consumes 3 meals per day, which consists of chicken and vegetables. Pt explains that he works at [employer] and often eats his meals from the hot bar. He also walks 1.5 miles daily at a park.   RD will also provide referral to '[facility] for further support.   RD provided "Heart Healthy, Consistent Carbohydrate Nutrition Therapy" handout from the Academy of [employer] and [employer]etics. Reviewed patient's dietary recall. Provided examples on ways to decrease sodium intake in diet. Discouraged intake of processed foods and use of salt shaker. Encouraged fresh fruits and vegetables as well as whole grain sources of carbohydrates to maximize fiber intake.   RD discussed why it is important for patient to adhere to diet recommendations, and emphasized the role of fluids, foods to avoid, and importance of weighing self daily.   Discussed different food groups and their effects on blood sugar, emphasizing carbohydrate-containing foods. Provided list of carbohydrates and recommended serving sizes of common foods.  Discussed importance of controlled and consistent carbohydrate intake throughout the day. Provided examples of ways to balance meals/snacks and encouraged intake of high-fiber, whole grain complex carbohydrates. Teach back method used.  Expect fair to good compliance.  Body mass index is  38.83 kg/m. Pt meets criteria for obesity, class II based on current BMI.  Current diet order is 2 gram sodium, patient is consuming approximately 100% of meals at this time. Labs and medications reviewed. No further nutrition interventions warranted at this time. RD contact information provided. If additional nutrition issues arise, please re-consult RD.   Levada Schilling, RD, LDN, CDCES Registered Dietitian II Certified Diabetes Care and Education Specialist Please refer to [facility] for RD and/or RD on-call/weekend/after hours pager

## 2022-01-05 NOTE — Progress Notes (Signed)
Rounding Note    Patient Name: Dash Cardarelli Date of Encounter: 01/05/2022  [facility] Cardiologist: Debbe Odea, MD   Subjective   Patient seen on a.m. rounds.  Denies any chest pain, shortness of breath and states that leg edema is much improved.  -2 L output in the last 24 hours.  Continued on furosemide IV 3 times daily.  Inpatient Medications    Scheduled Meds:   stroke: early stages of recovery book   Does not apply Once   aspirin  81 mg Oral Daily   atorvastatin  40 mg Oral Daily   clopidogrel  75 mg Oral Daily   enoxaparin (LOVENOX) injection  0.5 mg/kg Subcutaneous Q24H   furosemide  40 mg Intravenous TID   insulin aspart  0-5 Units Subcutaneous QHS   insulin aspart  0-9 Units Subcutaneous TID WC   insulin glargine-yfgn  20 Units Subcutaneous BID   losartan  50 mg Oral Daily   Continuous Infusions:  PRN Meds: acetaminophen, albuterol, dextromethorphan-guaiFENesin, hydrALAZINE, ondansetron (ZOFRAN) IV   Vital Signs    Vitals:   01/04/22 2316 01/05/22 0437 01/05/22 0735 01/05/22 1112  BP: (!) 175/114 (!) 141/84 (!) 156/103 (!) 145/86  Pulse: 87 80 84 85  Resp: 18 18 19 20   Temp: 97.7 F (36.5 C) (!) 97.5 F (36.4 C) 98.3 F (36.8 C) 98.2 F (36.8 C)  TempSrc: Oral     SpO2: 99% 98% 99% 98%  Weight:      Height:        Intake/Output Summary (Last 24 hours) at 01/05/2022 1248 Last data filed at 01/05/2022 0439 Gross per 24 hour  Intake --  Output 2000 ml  Net -2000 ml      01/04/2022    9:26 PM 01/02/2022    9:26 PM 10/10/2021    9:46 AM  Last 3 Weights  Weight (lbs) 310 lb 11.2 oz 340 lb 320 lb  Weight (kg) 140.933 kg 154.223 kg 145.151 kg      Telemetry    Sinus rhythm 70s and 80s- Personally Reviewed  ECG    No new tracings- Personally Reviewed  Physical Exam   GEN: No acute distress.   Neck: No JVD Cardiac: RRR, no murmurs, rubs, or gallops.  Respiratory: Clear to auscultation bilaterally.  Respirations are  unlabored on room air. GI: Soft, nontender, non-distended  MS: 2+ edema; No deformity. Neuro:  Nonfocal  Psych: Normal affect   Labs    High Sensitivity Troponin:   Recent Labs  Lab 01/01/22 1327 01/01/22 1720 01/02/22 2134 01/03/22 0212 01/03/22 1027  TROPONINIHS 39* 31* 32* 27* 22*     Chemistry Recent Labs  Lab 01/01/22 1327 01/02/22 2134 01/04/22 0651  NA 141 139 140  K 4.0 3.7 3.7  CL 108 106 106  CO2 23 24 27   GLUCOSE 118* 220* 121*  BUN 12 15 11   CREATININE 1.06 1.23 0.95  CALCIUM 9.0 9.0 8.8*  MG  --   --  2.0  PROT  --  7.5  --   ALBUMIN  --  3.8  --   AST  --  33  --   ALT  --  27  --   ALKPHOS  --  68  --   BILITOT  --  1.0  --   GFRNONAA >60 >60 >60  ANIONGAP 10 9 7     Lipids  Recent Labs  Lab 01/03/22 1027  CHOL 180  TRIG 105  HDL 43  LDLCALC 116*  CHOLHDL 4.2    Hematology Recent Labs  Lab 01/01/22 1327 01/02/22 2134  WBC 4.2 3.8*  RBC 5.32 5.47  HGB 13.1 13.5  HCT 42.2 43.5  MCV 79.3* 79.5*  MCH 24.6* 24.7*  MCHC 31.0 31.0  RDW 14.5 14.6  PLT 206 209   Thyroid No results for input(s): "TSH", "FREET4" in the last 168 hours.  BNP Recent Labs  Lab 01/01/22 1327 01/02/22 2134  BNP 145.4* 140.7*    DDimer  Recent Labs  Lab 01/01/22 1327  DDIMER 1.58*     Radiology    ECHOCARDIOGRAM COMPLETE  Result Date: 01/05/2022    ECHOCARDIOGRAM REPORT   Patient Name:   Varney BaasJOSEPH Hellinger Date of Exam: 01/05/2022 Medical Rec #:  [id number]       Height:       75.0 in Accession #:    [id number]      Weight:       310.7 lb Date of Birth:  21-Aug-1960       BSA:          2.646 m Patient Age:    61 years        BP:           156/103 mmHg Patient Gender: M               HR:           84 bpm. Exam Location:  [facility] Procedure: 2D Echo, Cardiac Doppler and Color Doppler Indications:     CHF--acute diastolic I50.31  History:         Patient has no prior history of Echocardiogram examinations.                  Risk Factors:Diabetes and Hypertension.   Sonographer:     Cristela BlueJerry Hege Referring Phys:  [id number] Debbe OdeaBRIAN AGBOR-ETANG Diagnosing Phys: Lorine BearsMuhammad Arida MD  Sonographer Comments: Suboptimal apical window. IMPRESSIONS  1. Left ventricular ejection fraction, by estimation, is 50 to 55%. The left ventricle has low normal function. The left ventricle has no regional wall motion abnormalities. There is mild left ventricular hypertrophy. Left ventricular diastolic parameters are indeterminate.  2. Right ventricular systolic function is normal. The right ventricular size is normal. Tricuspid regurgitation signal is inadequate for assessing PA pressure.  3. Left atrial size was mildly dilated.  4. The mitral valve is normal in structure. No evidence of mitral valve regurgitation. No evidence of mitral stenosis.  5. The aortic valve is normal in structure. Aortic valve regurgitation is not visualized. No aortic stenosis is present. FINDINGS  Left Ventricle: Left ventricular ejection fraction, by estimation, is 50 to 55%. The left ventricle has low normal function. The left ventricle has no regional wall motion abnormalities. The left ventricular internal cavity size was normal in size. There is mild left ventricular hypertrophy. Left ventricular diastolic parameters are indeterminate. Right Ventricle: The right ventricular size is normal. No increase in right ventricular wall thickness. Right ventricular systolic function is normal. Tricuspid regurgitation signal is inadequate for assessing PA pressure. Left Atrium: Left atrial size was mildly dilated. Right Atrium: Right atrial size was normal in size. Pericardium: There is no evidence of pericardial effusion. Mitral Valve: The mitral valve is normal in structure. No evidence of mitral valve regurgitation. No evidence of mitral valve stenosis. Tricuspid Valve: The tricuspid valve is normal in structure. Tricuspid valve regurgitation is not demonstrated. No evidence of tricuspid stenosis. Aortic Valve: The aortic valve is  normal in  structure. Aortic valve regurgitation is not visualized. No aortic stenosis is present. Aortic valve mean gradient measures 1.0 mmHg. Aortic valve peak gradient measures 1.7 mmHg. Aortic valve area, by VTI measures 3.68 cm. Pulmonic Valve: The pulmonic valve was normal in structure. Pulmonic valve regurgitation is not visualized. No evidence of pulmonic stenosis. Aorta: The aortic root is normal in size and structure. Venous: The inferior vena cava was not well visualized. IAS/Shunts: No atrial level shunt detected by color flow Doppler.  LEFT VENTRICLE PLAX 2D LVIDd:         5.10 cm   Diastology LVIDs:         3.60 cm   LV e' medial:    5.00 cm/s LV PW:         1.20 cm   LV E/e' medial:  11.2 LV IVS:        1.20 cm   LV e' lateral:   5.00 cm/s LVOT diam:     2.10 cm   LV E/e' lateral: 11.2 LV SV:         42 LV SV Index:   16 LVOT Area:     3.46 cm  RIGHT VENTRICLE RV Basal diam:  4.10 cm RV S prime:     13.70 cm/s TAPSE (M-mode): 2.7 cm LEFT ATRIUM             Index        RIGHT ATRIUM           Index LA diam:        3.40 cm 1.28 cm/m   RA Area:     21.00 cm LA Vol (A2C):   89.6 ml 33.86 ml/m  RA Volume:   60.00 ml  22.67 ml/m LA Vol (A4C):   59.0 ml 22.30 ml/m LA Biplane Vol: 81.1 ml 30.65 ml/m  AORTIC VALVE AV Area (Vmax):    2.84 cm AV Area (Vmean):   2.70 cm AV Area (VTI):     3.68 cm AV Vmax:           64.60 cm/s AV Vmean:          49.700 cm/s AV VTI:            0.113 m AV Peak Grad:      1.7 mmHg AV Mean Grad:      1.0 mmHg LVOT Vmax:         52.90 cm/s LVOT Vmean:        38.800 cm/s LVOT VTI:          0.120 m LVOT/AV VTI ratio: 1.06  AORTA Ao Root diam: 3.70 cm MITRAL VALVE               TRICUSPID VALVE MV Area (PHT): 5.09 cm    TR Peak grad:   8.0 mmHg MV Decel Time: 149 msec    TR Vmax:        141.00 cm/s MV E velocity: 56.10 cm/s MV A velocity: 56.60 cm/s  SHUNTS MV E/A ratio:  0.99        Systemic VTI:  0.12 m                            Systemic Diam: 2.10 cm Lorine Bears MD  Electronically signed by Lorine Bears MD Signature Date/Time: 01/05/2022/9:23:14 AM    Final    US Carotid Bilateral (at [facility] and [facility] only)  Result Date: 01/03/2022 CLINICAL DATA:  Stroke EXAM: BILATERAL CAROTID DUPLEX ULTRASOUND TECHNIQUE: Wallace Cullens scale imaging, color Doppler and duplex ultrasound were performed of bilateral carotid and vertebral arteries in the neck. COMPARISON:  None Available. FINDINGS: Criteria: Quantification of carotid stenosis is based on velocity parameters that correlate the residual internal carotid diameter with NASCET-based stenosis levels, using the diameter of the distal internal carotid lumen as the denominator for stenosis measurement. The following velocity measurements were obtained: RIGHT ICA: 62 cm/sec CCA: 70 cm/sec SYSTOLIC ICA/CCA RATIO:  0.9 ECA: 60 cm/sec LEFT ICA: 76 cm/sec CCA: 75 cm/sec SYSTOLIC ICA/CCA RATIO:  1.0 ECA: 55 cm/sec RIGHT CAROTID ARTERY: No significant atherosclerotic disease visualized on grayscale imaging. RIGHT VERTEBRAL ARTERY:  Antegrade LEFT CAROTID ARTERY: No significant atherosclerotic disease visualized on grayscale imaging. LEFT VERTEBRAL ARTERY:  Antegrade IMPRESSION: No evidence of flow limiting stenosis. Electronically Signed   By: Allegra Lai M.D.   On: 01/03/2022 18:39   MR ANGIO HEAD WO CONTRAST  Result Date: 01/03/2022 CLINICAL DATA:  Right internal capsule acute infarct. EXAM: MRA HEAD WITHOUT CONTRAST TECHNIQUE: Angiographic images of the Circle of Willis were acquired using MRA technique without intravenous contrast. COMPARISON:  MR head without contrast 01/03/2022 FINDINGS: Anterior circulation: Internal carotid arteries are within normal limits high cervical segments through the ICA termini. The A1 and M1 segments are normal. Anterior communicating artery is patent. ACA and MCA branch vessels are within normal limits. Posterior circulation: The vertebral arteries are codominant. PICA origins are visualized. Vertebrobasilar  junction artery are normal. Both posterior cerebral arteries originate from the basilar tip. Post median artery contributes. PCA branch vessels within normal limits bilaterally. Anatomic variants: None IMPRESSION: Normal MRA circle-of-Willis. No significant proximal stenosis, aneurysm, or branch vessel occlusion. Electronically Signed   By: Marin Roberts M.D.   On: 01/03/2022 16:11   MR BRAIN WO CONTRAST  Result Date: 01/03/2022 CLINICAL DATA:  Headache, new or worsening. Progressive shortness of breath. EXAM: MRI HEAD WITHOUT CONTRAST TECHNIQUE: Multiplanar, multiecho pulse sequences of the brain and surrounding structures were obtained without intravenous contrast. COMPARISON:  A 9 mm acute scratched at CT head without contrast 01/02/22 FINDINGS: Brain: Acute nonhemorrhagic infarct is confirmed within the genu of the right internal capsule. Infarct measures 12 mm maximally on coronal images. T2 and FLAIR hyperintensities associated. No acute hemorrhage is scratched at no cc hemorrhage is present. Scattered subcortical T2 hyperintensities bilaterally are mildly advanced for age. No additional acute infarcts are present. The ventricles are of normal size. No significant extraaxial fluid collection is present. The brainstem and cerebellum are within normal limits. Vascular: Flow is present in the major intracranial arteries. Skull and upper cervical spine: The craniocervical junction is normal. Upper cervical spine is within normal limits. Marrow signal is unremarkable. Sinuses/Orbits: The paranasal sinuses and mastoid air cells are clear. The globes and orbits are within normal limits. IMPRESSION: 1. 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 2. Scattered subcortical T2 hyperintensities bilaterally are mildly advanced for age. The finding is nonspecific but can be seen in the setting of chronic microvascular ischemia, a demyelinating process such as multiple sclerosis,  vasculitis, complicated migraine headaches, or as the sequelae of a prior infectious or inflammatory process. These results will be called to the ordering clinician or representative by the Radiologist Assistant, and communication documented in the PACS or [employer]. Electronically Signed   By: Marin Roberts M.D.   On: 01/03/2022 14:48    Cardiac Studies  Echocardiogram completed 01/05/22 1. Left ventricular ejection  fraction, by estimation, is 50 to 55%. The  left ventricle has low normal function. The left ventricle has no regional  wall motion abnormalities. There is mild left ventricular hypertrophy.  Left ventricular diastolic  parameters are indeterminate.   2. Right ventricular systolic function is normal. The right ventricular  size is normal. Tricuspid regurgitation signal is inadequate for assessing  PA pressure.   3. Left atrial size was mildly dilated.   4. The mitral valve is normal in structure. No evidence of mitral valve  regurgitation. No evidence of mitral stenosis.   5. The aortic valve is normal in structure. Aortic valve regurgitation is  not visualized. No aortic stenosis is present.   Patient Profile     61 y.o. male with a history of hypertension and diabetes who presented with shortness of breath and peripheral edema who is being seen and evaluated for acute congestive heart failure, who was also found to have an acute nonhemorrhagic infarct in the right internal capsule.  Assessment & Plan    Acute congestive heart failure symptoms - -2.3L in the last 24 hours -Creatinine remains normal continue to diurese his kidneys allow -BNP of 140 -Shortness of breath, weight gain, peripheral edema -LVEF on echocardiogram was 50 to 55%, no regional wall motion abnormalities, and no valvular abnormalities -Continues to be volume up -Continue furosemide 40 mg IV 3 times daily -Daily weights, strict I&O's, low-sodium diet -Currently maintained on fluid  restriction  Hypertension -Blood pressure 145/86 -Acute stroke which allows for permissive hypertension for 24 hours -Continue losartan 50 mg daily, if blood pressure remains elevated can increase dose -Vital signs per unit protocol  Acute stroke -Continue aspirin, Plavix, statin -Continue with early stages of stroke recovery education -Management per neurology and IM  Diabetes -A1c 8.2 -Continue insulin while hospitalized -Management per IM team     For questions or updates, please contact [facility] Please consult [email or web address] for contact info under        Signed, Sweta Halseth, NP  01/05/2022, 12:48 PM

## 2022-01-06 NOTE — Telephone Encounter (Signed)
Transition Care Management Follow-up Telephone Call Date of discharge and from where: 01/05/22 [facility] How have you been since you were released from the hospital? Some dizziness/ lightheadedness, but this is not new or different from prior to discharge Any questions or concerns? No  Items Reviewed: Did the pt receive and understand the discharge instructions provided? Yes  Medications obtained and verified? Yes  (Currently at [employer] picking these up as we speak Other? No  Any new allergies since your discharge? No  Dietary orders reviewed? No Do you have support at home? No   Home Care and Equipment/Supplies: Were home health services ordered? no If so, what is the name of the agency? N/A   Has the agency set up a time to come to the patient's home? not applicable Were any new equipment or medical supplies ordered?  No What is the name of the medical supply agency? N/A Were you able to get the supplies/equipment? not applicable Do you have any questions related to the use of the equipment or supplies? N/A  Functional Questionnaire: (I = Independent and D = Dependent) ADLs: I  Bathing/Dressing- I  Meal Prep- I  Eating- I  Maintaining continence- I  Transferring/Ambulation- I  Managing Meds- I  Follow up appointments reviewed:  PCP Hospital f/u appt confirmed? No  - currently has no PCP Specialist Hospital f/u appt confirmed? Yes  Scheduled to see Eduard Clos on 01/14/22 @ 8:25 am. Are transportation arrangements needed? No  If their condition worsens, is the pt aware to call PCP or go to the Emergency Dept.? Yes Was the patient provided with contact information for the PCP's office or ED? No (previously provided info for PCP at Discharge) Was to pt encouraged to call back with questions or concerns? Yes

## 2022-01-12 ENCOUNTER — Emergency Department
Admission: EM | Admit: 2022-01-12 | Discharge: 2022-01-12 | Disposition: A | Discharge status: Home / Self Care | Payer: BC Managed Care – PPO | Attending: Emergency Medicine | Admitting: Emergency Medicine

## 2022-01-12 ENCOUNTER — Other Ambulatory Visit: Payer: Self-pay

## 2022-01-12 ENCOUNTER — Emergency Department: Payer: BC Managed Care – PPO

## 2022-01-12 DIAGNOSIS — I5031 Acute diastolic (congestive) heart failure: Secondary | ICD-10-CM | POA: Diagnosis not present

## 2022-01-12 DIAGNOSIS — I110 Hypertensive heart disease with heart failure: Secondary | ICD-10-CM | POA: Insufficient documentation

## 2022-01-12 DIAGNOSIS — E119 Type 2 diabetes mellitus without complications: Secondary | ICD-10-CM | POA: Insufficient documentation

## 2022-01-12 DIAGNOSIS — M542 Cervicalgia: Secondary | ICD-10-CM | POA: Insufficient documentation

## 2022-01-12 DIAGNOSIS — R519 Headache, unspecified: Secondary | ICD-10-CM | POA: Diagnosis not present

## 2022-01-12 DIAGNOSIS — I160 Hypertensive urgency: Secondary | ICD-10-CM | POA: Insufficient documentation

## 2022-01-12 LAB — SEDIMENTATION RATE: Sed Rate: 6 mm/hr (ref 0–20)

## 2022-01-12 MED ORDER — CARBAMAZEPINE ER 100 MG PO TB12: 100.0000 mg | ORAL_TABLET | Freq: Two times a day (BID) | ORAL | 0 refills | Status: DC

## 2022-01-12 MED ORDER — HYDROMORPHONE HCL 1 MG/ML IJ SOLN
1.0000 mg | Freq: Once | INTRAMUSCULAR | Status: AC
  Administered 2022-01-12: 1 mg via INTRAVENOUS
  Filled 2022-01-12: qty 1

## 2022-01-12 MED ORDER — CARBAMAZEPINE 200 MG PO TABS
100.0000 mg | ORAL_TABLET | Freq: Once | ORAL | Status: AC
  Administered 2022-01-12: 100 mg via ORAL
  Filled 2022-01-12: qty 0.5
  Filled 2022-01-12: qty 1

## 2022-01-12 MED ORDER — MORPHINE SULFATE (PF) 4 MG/ML IV SOLN
4.0000 mg | Freq: Once | INTRAVENOUS | Status: AC
  Administered 2022-01-12: 4 mg via INTRAVENOUS
  Filled 2022-01-12: qty 1

## 2022-01-12 MED ORDER — METOCLOPRAMIDE HCL 5 MG/ML IJ SOLN
10.0000 mg | Freq: Once | INTRAMUSCULAR | Status: AC
  Administered 2022-01-12: 10 mg via INTRAVENOUS
  Filled 2022-01-12: qty 2

## 2022-01-12 NOTE — ED Triage Notes (Signed)
Pt here with severe headache pain and dizziness that started an hour ago. Pt states it hurts to the point that he cannot talk or smile. Pt states this has not happened to him before. Pt also has all over weakness. Pt was also here last week for neuro symptoms as well.

## 2022-01-12 NOTE — ED Provider Notes (Signed)
See if signout from previous provider pending inflammatory markers, recheck pain.  ESR within normal limits.  CRP has been pending for quite some time, needs to be sent to [place] Per nursing report, unknown when it will come back.  I discussed with the patient, given a normal ESR, and pain is not severe at this time, no visual changes, we decided with shared decision making for discharge now with carbamazepine and neurology follow-up, PMD follow-up in the morning.  Silas Wong MD    Wong, Silas, MD 01/12/22 2243  

## 2022-01-12 NOTE — ED Triage Notes (Signed)
First Nurse Note;  Pt via [facility] from work. Pt was here last week for a "small stroke." Pt states he has been having headache since this morning. Pt is A&Ox4 and NAD.   182/100 CBG 113  97% on RA

## 2022-01-12 NOTE — ED Notes (Signed)
Assumed care, pt reports rx HTN meds a few days ago and did not pick up rx from pharmacy. States headache this AM, took "2 Motrin" approx 0400. Pt alert and oriented in NAD.

## 2022-01-12 NOTE — ED Notes (Signed)
Pain reassessment and patient states that his pain is still 10/10 after dilaudid.   Patient doesn't appear in any distress at this time.

## 2022-01-12 NOTE — ED Provider Notes (Signed)
[facility] Provider Note    Event Date/Time   First MD Initiated Contact with Patient 01/12/22 1146     (approximate)   History   Headache and Dizziness   HPI  Edwin Edwards is a 61 y.o. male past medical history of congestive heart failure, pretension, diabetes, recent internal capsule CVA who presents with headache.  Symptoms started around 9 AM today.  Patient has headache just on the right side of his face.  Pain has been constant since onset.  It is worse when he opens his mouth or moves his face.  He endorses pain in the gums and ear as well.  Feels like the right eye is blurred denies numbness tingling weakness.  Does also endorse right-sided neck pain.     Past Medical History:  Diagnosis Date   Diabetes mellitus without complication ([place])    Hypertension     Patient Active Problem List   Diagnosis Date Noted   Acute diastolic (congestive) heart failure (HCC)    Acute CHF (congestive heart failure) ([place]) 01/03/2022   Obesity, Class III, BMI 40-49.9 (morbid obesity) ([place]) 01/03/2022   Diabetes mellitus without complication (HCC)    Hypertension    Stroke [facility])    Myocardial injury    Hypertensive urgency      Physical Exam  Triage Vital Signs: ED Triage Vitals  Enc Vitals Group     BP 01/12/22 1136 (!) 161/102     Pulse Rate 01/12/22 1136 79     Resp 01/12/22 1136 18     Temp 01/12/22 1136 97.8 F (36.6 C)     Temp Source 01/12/22 1136 Oral     SpO2 01/12/22 1136 97 %     Weight 01/12/22 1135 (!) 310 lb 10.1 oz (140.9 kg)     Height 01/12/22 1135 _0  (1.905 m)     Head Circumference --      Peak Flow --      Pain Score 01/12/22 1135 10     Pain Loc --      Pain Edu? --      Excl. in [place]? --     Most recent vital signs: Vitals:   01/12/22 1230 01/12/22 1401  BP: (!) 155/96 (!) 164/104  Pulse: 77   Resp: 13   Temp:    SpO2: 96%      General: Awake, patient looks uncomfortable CV:  Good peripheral perfusion.   Resp:  Normal effort.  Abd:  No distention.  Neuro:             Awake, Alert, Oriented x 3  Other:  Aox3, nml speech  PERRL, EOMI, face symmetric, nml tongue movement  5/5 strength in the BL upper and lower extremities  Sensation grossly intact in the BL upper and lower extremities  Finger-nose-finger intact BL  No obvious swelling or asymmetry of the face Right TM is normal No rashes Patient is sensitive to touch on the entire right side of his face and right gums and winces in pain to light touch No trismus No obvious oral lesions   ED Results / Procedures / Treatments  Labs (all labs ordered are listed, but only abnormal results are displayed) Labs Reviewed  SEDIMENTATION RATE  C-REACTIVE PROTEIN     EKG     RADIOLOGY I reviewed and interpreted the CT scan of the brain which does not show any acute intracranial process    PROCEDURES:  Critical Care performed: No  Procedures  The patient is on the cardiac monitor to evaluate for evidence of arrhythmia and/or significant heart rate changes.   MEDICATIONS ORDERED IN ED: Medications  HYDROmorphone (DILAUDID) injection 1 mg (has no administration in time range)  morphine (PF) 4 MG/ML injection 4 mg (4 mg Intravenous Given 01/12/22 1222)  metoCLOPramide (REGLAN) injection 10 mg (10 mg Intravenous Given 01/12/22 1221)     IMPRESSION / MDM / ASSESSMENT AND PLAN / ED COURSE  I reviewed the triage vital signs and the nursing notes.                              Patient's presentation is most consistent with acute complicated illness / injury requiring diagnostic workup.  Differential diagnosis includes, but is not limited to, migraine headache, cluster headache, trigeminal neuralgia, early shingles, dental pain, TMJ pain  The patient is a 61 year old male who presents with right-sided facial pain.  Symptoms started around 9 AM today.  Involves the entire side of the face comes ear right neck.  It is a constant  pain.  Patient tells me he has never had pain like this in the past however I saw the patient 3 months ago and per note he was having sharp intermittent right-sided facial pain and was given diagnosis of likely trigeminal neuralgia advised to follow-up with neurology.  Patient also recently hospitalized just discharged several days ago for CHF exacerbation was having headaches at that time MRI of the brain showed a subacute internal capsule infarct he was seen by neurology recommended aspirin.  Rest of his stroke work-up was negative.  Looks uncomfortable he has hyperalgesia and is tender even to light touch in the entire right side of his face and in the right mouth and gums and teeth.  I do not see any rash TMs normal rest of his neurologic exam is normal.  Suspect some sort of neuropathic pain.  The acuity of onset will obtain CT head but this is likely to be a low yield.  Given recent diagnosis of stroke and CHF will avoid NSAIDs treat with Reglan and morphine.   Patient CT head is negative.  After morphine his pain is really not improved.  Discussed with Dr. Rory Percy who also recommends getting ESR and CRP to screen for temporal arteritis.  He says if significantly elevated will need steroids and likely admission.  If negative it is reasonable to start carbamazepine as treatment for trigeminal neuralgia.  Recommend starting 100 twice daily can increase to 200 twice daily after second week but patient will need drug monitoring as hyponatremia significant side effect.  Signed out to oncoming provider pending results of his amatory markers     FINAL CLINICAL IMPRESSION(S) / ED DIAGNOSES   Final diagnoses:  Facial pain     Rx / DC Orders   ED Discharge Orders     None        Note:  This document was prepared using Dragon voice recognition software and may include unintentional dictation errors.   Rada Hay, MD 01/12/22 [id number]

## 2022-01-12 NOTE — Discharge Instructions (Addendum)
Take carbamazepine 100mg  twice daily for two weeks.  Then you should talk to your doctor about extending your prescription and increasing to 200 mg twice daily after 2 weeks.  Have your doctor prescribe this medication for you after your first prescription runs out.  Is important to talk to your doctor about this medication because it can affect your blood levels and you need your sodium level checked while on this medication.  See your doctor this week to schedule a follow-up appointment and to discuss checking your blood tests as this medication may affect your blood sodium levels.  Also, discuss with your doctor the result of your remaining blood test that was still pending as of your discharge.  Call neurology (DR ) for a follow-up appointment regarding your complex headaches.  Thank you for choosing [facility] for your health care today!  Please see your primary doctor this week for a follow up appointment.   If you do not have a primary doctor call the following clinics to establish care:  If you have insurance:  [facility] [phone] [address] [place] [place]   [address] [facility]  [phone] [address] [place] [place]   If you do not have insurance:  [facility]  [phone] [address] [place]  Sometimes, in the early stages of certain disease courses it is difficult to detect in the emergency department evaluation -- so, it is important that you continue to monitor your symptoms and call your doctor right away or return to the emergency department if you develop any new or worsening symptoms.  It was my pleasure to care for you today.   [id number] Daneil Dan, MD

## 2022-01-13 LAB — C-REACTIVE PROTEIN: CRP: 0.8 mg/dL (ref <1.0)

## 2022-01-13 NOTE — Progress Notes (Deleted)
Cardiology Office Note    Date:  01/13/2022   ID:  Edwin Edwards, DOB [birth year], MRN [id number]  PCP:  Patient, No Pcp Per  Cardiologist:  Debbe Odea, MD  Electrophysiologist:  None   Chief Complaint: Hospital follow-up  History of Present Illness:   Ashwath Lasch is a 61 y.o. male with history of HFpEF, history of CVA, HTN, and diabetes who presents for hospital follow-up as outlined below.  He was seen in the ED on 01/01/2022 with shortness of breath and lower extremity swelling.  Initial and peak high-sensitivity troponin 39.  BNP 145.  D-dimer 1.58.  Chest x-ray showed cardiomegaly with increased interstitial markings and small bilateral pleural effusion.  CTA chest was negative for evidence of PE with sequela of prior granulomatous disease and bilateral pleural effusions.  He was given Lasix in the ED with good urine output with recommendation to follow-up as an outpatient.  He returned to the ED on 8/25 with continued shortness of breath and was admitted to the hospital from 8/26 through 8/23 with acute HFpEF.  BMP 140.  High-sensitivity troponin continued to downtrend from the prior peak of 39 1 day prior.  Urine drug screen negative.  Repeat chest x-ray showed mild central pulmonary vascular engorgement with no overt pulmonary edema.  In the ED, he also noted a headache with CT head showing no evidence of hemorrhage or mass.  A new hypodensity within the right internal capsule was identified consistent with small infarct of uncertain age.  Subsequent MRI of the brain showed an acute nonhemorrhagic infarct involving the genu of the right internal capsule as well as scattered subcortical hyperintensities bilaterally.  MRA of the head was nonacute.  Carotid artery ultrasound showed no evidence of flow-limiting stenosis.  Echo showed an EF of 50 to 55%, no regional wall motion abnormalities, mild LVH, indeterminate LV diastolic function parameters, normal RV systolic function and  ventricular cavity size, mildly dilated left atrium, and no significant valvular abnormalities.  Carotid artery ultrasound showed no evidence of flow-limiting stenosis.  He had symptomatic improvement with IV diuresis.  Following discharge on 8/28, he was seen at an outside ED on 8/29 with continued shortness of breath.  Chest x-ray there showed mild cardiomegaly with mild congestion changes with nodular changes.  proBNP 60.  Initial and peak high-sensitivity troponin 30.  He was advised to follow-up as an outpatient.  He was most recently seen in the ED on 01/12/2022 with headache.  Repeat CT head showed no acute intracranial findings.  CRP and sed rate were normal.  It was recommended he follow-up with PCP and neurology as an outpatient.  *** He needs follow-up with PCP, neurology, pulmonology   Labs independently reviewed: 12/2021 - Hgb 14.8, PLT 257, potassium 4.0, BUN 23, serum creatinine 1.36, albumin 4.0, AST/ALT normal, magnesium 2.1, TC 180, TG 105, HDL 43, LDL 116, A1c 8.2  Past Medical History:  Diagnosis Date   Diabetes mellitus without complication (HCC)    Hypertension     Past Surgical History:  Procedure Laterality Date   ABDOMINAL SURGERY     COLON SURGERY      Current Medications: No outpatient medications have been marked as taking for the 01/14/22 encounter (Appointment) with Sondra Barges, PA-C.    Allergies:   Patient has no known allergies.   Social History   Socioeconomic History   Marital status: Single    Spouse name: Not on file   Number of children: Not on file  Years of education: Not on file   Highest education level: Not on file  Occupational History   Not on file  Tobacco Use   Smoking status: Never   Smokeless tobacco: Never  Substance and Sexual Activity   Alcohol use: Not Currently   Drug use: Not Currently   Sexual activity: Not on file  Other Topics Concern   Not on file  Social History Narrative   Not on file   Social Determinants  of Health   Financial Resource Strain: Not on file  Food Insecurity: Not on file  Transportation Needs: Not on file  Physical Activity: Not on file  Stress: Not on file  Social Connections: Not on file     Family History:  The patient's family history includes Diabetes in his mother; Heart attack in his mother; Hypertension in his mother.  ROS:   ROS   EKGs/Labs/Other Studies Reviewed:    Studies reviewed were summarized above. The additional studies were reviewed today:  2D echo 01/05/2022: 1. Left ventricular ejection fraction, by estimation, is 50 to 55%. The  left ventricle has low normal function. The left ventricle has no regional  wall motion abnormalities. There is mild left ventricular hypertrophy.  Left ventricular diastolic  parameters are indeterminate.   2. Right ventricular systolic function is normal. The right ventricular  size is normal. Tricuspid regurgitation signal is inadequate for assessing  PA pressure.   3. Left atrial size was mildly dilated.   4. The mitral valve is normal in structure. No evidence of mitral valve  regurgitation. No evidence of mitral stenosis.   5. The aortic valve is normal in structure. Aortic valve regurgitation is  not visualized. No aortic stenosis is present.   EKG:  EKG is ordered today.  The EKG ordered today demonstrates ***  Recent Labs: 01/02/2022: ALT 27; B Natriuretic Peptide 140.7; Hemoglobin 13.5; Platelets 209 01/04/2022: BUN 11; Creatinine, Ser 0.95; Magnesium 2.0; Potassium 3.7; Sodium 140  Recent Lipid Panel    Component Value Date/Time   CHOL 180 01/03/2022 1027   TRIG 105 01/03/2022 1027   HDL 43 01/03/2022 1027   CHOLHDL 4.2 01/03/2022 1027   VLDL 21 01/03/2022 1027   LDLCALC 116 (H) 01/03/2022 1027    PHYSICAL EXAM:    VS:  There were no vitals taken for this visit.  BMI: There is no height or weight on file to calculate BMI.  Physical Exam  Wt Readings from Last 3 Encounters:  01/12/22 (!) 310  lb 10.1 oz (140.9 kg)  01/04/22 (!) 310 lb 11.2 oz (140.9 kg)  10/10/21 (!) 320 lb (145.2 kg)     ASSESSMENT & PLAN:   HFpEF:  Elevated troponin:  HTN: Blood pressure  HLD: LDL 116.  History of CVA/headache::  DM2: A1c 8.2.  Abnormal chest x-ray/CTA chest:   {Are you ordering a CV Procedure (e.g. stress test, cath, DCCV, TEE, etc)?   Press F2        :[id number]}     Disposition: F/u with Dr. Azucena Cecil or an APP in ***.   Medication Adjustments/Labs and Tests Ordered: Current medicines are reviewed at length with the patient today.  Concerns regarding medicines are outlined above. Medication changes, Labs and Tests ordered today are summarized above and listed in the Patient Instructions accessible in Encounters.   Signed, Eula Listen, PA-C 01/13/2022 9:51 AM     [facility] [address] [place], [place] [address] [phone]

## 2022-01-14 ENCOUNTER — Ambulatory Visit: Payer: BC Managed Care – PPO | Attending: Physician Assistant | Admitting: Physician Assistant

## 2022-01-14 ENCOUNTER — Encounter: Payer: Self-pay | Admitting: Physician Assistant

## 2022-01-23 ENCOUNTER — Telehealth: Payer: Self-pay | Admitting: Family

## 2022-01-23 ENCOUNTER — Ambulatory Visit: Payer: BC Managed Care – PPO | Admitting: Family

## 2022-01-23 NOTE — Progress Notes (Deleted)
Patient ID: Montgomery Favor, male    DOB: 30-Sep-1960, 61 y.o.   MRN: [id number]  HPI  Mr. Rhames is a male with a history of   ECHO 01/05/22 Left ventricular ejection fraction, by estimation, is 50 to 55%  Mr. Bantz presents as a new patient with a chief complaint of    Past Medical History:  Diagnosis Date   Diabetes mellitus without complication ([place])    Hypertension    Past Surgical History:  Procedure Laterality Date   ABDOMINAL SURGERY     COLON SURGERY     Family History  Problem Relation Age of Onset   Hypertension Mother    Diabetes Mother    Heart attack Mother    Social History   Tobacco Use   Smoking status: Never   Smokeless tobacco: Never  Substance Use Topics   Alcohol use: Not Currently   No Known Allergies Prior to Admission medications   Medication Sig Start Date End Date Taking? Authorizing Provider  aspirin 81 MG chewable tablet Chew 1 tablet (81 mg total) by mouth daily. 01/06/22   Fritzi Mandes, MD  atorvastatin (LIPITOR) 40 MG tablet Take 1 tablet (40 mg total) by mouth daily. 01/06/22   Fritzi Mandes, MD  blood glucose meter kit and supplies Dispense based on patient and insurance preference. Use up to four times daily as directed. (FOR ICD-10 E10.9, E11.9). 01/05/22   Fritzi Mandes, MD  carbamazepine (TEGRETOL-XR) 100 MG 12 hr tablet Take 1 tablet (100 mg total) by mouth 2 (two) times daily for 14 days. 01/12/22 01/26/22  Lucillie Garfinkel, MD  clopidogrel (PLAVIX) 75 MG tablet Take 1 tablet (75 mg total) by mouth daily. Daily for 21 days only 01/06/22   Fritzi Mandes, MD  furosemide (LASIX) 20 MG tablet Take 2 tablets (40 mg total) by mouth daily. 01/05/22   Fritzi Mandes, MD  HYDROcodone-acetaminophen (NORCO/VICODIN) 5-325 MG tablet Take 2 tablets by mouth every 6 (six) hours as needed for moderate pain or severe pain. 09/10/21   Hinda Kehr, MD  insulin glargine (LANTUS SOLOSTAR) 100 UNIT/ML Solostar Pen Inject 40 Units into the skin 2 (two) times daily. 01/05/22    Fritzi Mandes, MD  Insulin Pen Needle (PEN NEEDLES 3/16") 31G X 5 MM MISC 1 Needle by Does not apply route 4 (four) times daily. 01/05/22   Fritzi Mandes, MD  losartan (COZAAR) 50 MG tablet Take 1 tablet (50 mg total) by mouth daily. 01/06/22   Fritzi Mandes, MD     Review of Systems    Physical Exam   Assessment and Plan   1. Chronic heart failure with reduced ejection fraction- - NYHA class II - euvolemic today based on patient's description of symptoms - weighing daily and he was instructed to call for an overnight weight gain of >2 pounds or a weekly weight gain of >5 pounds - not adding salt to his food and they've been reading food labels for sodium content. Encouraged him to try to keep daily sodium intake to <2061m / day - has telemedicine visit with cardiology (End) later today - titrate carvedilol at future visits if able - would like to try entresto but will have to watch renal function carefully - quit smoking during his recent admission - BNP 01/02/22 was 2448.4 - Saw Cardiology 01/13/22 (DUNN)   2: HTN- - BP ( ) - [facility] reviewed and showed sodium 142, potassium 3.5, creatinine 2.14 and GFR  32  3: CKD- - saw nephrology Percell Miller) 11/25/21  Medications reviewed with patient.   Follow-up month,sooner if needed.

## 2022-01-23 NOTE — Telephone Encounter (Signed)
Patient did not show for his initial [facility] appointment on 01/23/22. Will attempt to reschedule.   

## 2022-01-25 NOTE — Progress Notes (Unsigned)
   Patient ID: Edwin Edwards, male    DOB: 03/12/1961, 61 y.o.   MRN: [id number]  HPI  Edwin Edwards is a 61 y/o male with a history of  Echo report from 01/05/22 reviewed and showed an EF of 50-55% along with mild LVH.  Was in the ED 01/24/22 due to facial pain due to trigeminal neuralgia where he was evaluated and released. Was in the ED 01/12/22 due to facial pain and headache. Thought to be due to trigeminal neuralgia where he was evaluated and released. Was in the ED 01/06/22 due to SOB and HA where he was evaluated and released. Admitted 01/03/22 due to SOB, leg edema, weight gain and headache.  MRI-Brain which showed acute nonhemorrhagic infarct involving the genu of the right internal capsule. Initially given IV lasix with transition to oral diuretics. Cardiology and neurology consults obtained. Troponin elevation thought to be due to heart failure/ stroke. Discharged after 2 days.   He presents today for his initial visit with a chief complaint of   Review of Systems    Physical Exam    Assessment & Plan:  1: Chronic heart failure with preserved ejection fraction with structural changes (LVH)- - NYHA class - to see cardiology Kathlen Mody) 02/17/22 - pro-BNP 01/06/22 was 61  2: HTN- - BP - BMP 01/24/22 reviewed and showed sodium 139, potassium hemolyzed, creatinine 1.1 and GFR 77 - recheck BMP today  3: DM- - A1c 01/03/22 was 8.2%  4: Recent stroke-  5: Trigeminal neuralgia-

## 2022-01-27 ENCOUNTER — Other Ambulatory Visit
Admission: RE | Admit: 2022-01-27 | Discharge: 2022-01-27 | Disposition: A | Discharge status: Home / Self Care | Payer: BC Managed Care – PPO | Source: Ambulatory Visit | Attending: Family | Admitting: Family

## 2022-01-27 ENCOUNTER — Ambulatory Visit: Payer: BC Managed Care – PPO | Attending: Family | Admitting: Family

## 2022-01-27 ENCOUNTER — Encounter: Payer: Self-pay | Admitting: Family

## 2022-01-27 VITALS — BP 160/99 | HR 70 | Resp 14 | Ht 75.0 in | Wt 313.5 lb

## 2022-01-27 DIAGNOSIS — I639 Cerebral infarction, unspecified: Secondary | ICD-10-CM | POA: Diagnosis not present

## 2022-01-27 DIAGNOSIS — R5383 Other fatigue: Secondary | ICD-10-CM | POA: Insufficient documentation

## 2022-01-27 DIAGNOSIS — I110 Hypertensive heart disease with heart failure: Secondary | ICD-10-CM | POA: Insufficient documentation

## 2022-01-27 DIAGNOSIS — Z8673 Personal history of transient ischemic attack (TIA), and cerebral infarction without residual deficits: Secondary | ICD-10-CM | POA: Insufficient documentation

## 2022-01-27 DIAGNOSIS — E119 Type 2 diabetes mellitus without complications: Secondary | ICD-10-CM | POA: Diagnosis not present

## 2022-01-27 DIAGNOSIS — I5032 Chronic diastolic (congestive) heart failure: Secondary | ICD-10-CM | POA: Insufficient documentation

## 2022-01-27 DIAGNOSIS — Z7902 Long term (current) use of antithrombotics/antiplatelets: Secondary | ICD-10-CM | POA: Diagnosis not present

## 2022-01-27 DIAGNOSIS — I10 Essential (primary) hypertension: Secondary | ICD-10-CM | POA: Diagnosis not present

## 2022-01-27 DIAGNOSIS — Z7982 Long term (current) use of aspirin: Secondary | ICD-10-CM | POA: Diagnosis not present

## 2022-01-27 DIAGNOSIS — G500 Trigeminal neuralgia: Secondary | ICD-10-CM | POA: Insufficient documentation

## 2022-01-27 LAB — BASIC METABOLIC PANEL
Anion gap: 7 (ref 5–15)
BUN: 10 mg/dL (ref 6–20)
CO2: 25 mmol/L (ref 22–32)
Calcium: 9.2 mg/dL (ref 8.9–10.3)
Chloride: 109 mmol/L (ref 98–111)
Creatinine, Ser: 0.95 mg/dL (ref 0.61–1.24)
GFR, Estimated: >60 mL/min (ref >60)
Glucose, Bld: 135 mg/dL — ABNORMAL HIGH (ref 70–99)
Potassium: 4.1 mmol/L (ref 3.5–5.1)
Sodium: 141 mmol/L (ref 135–145)

## 2022-01-27 NOTE — Patient Instructions (Addendum)
Continue weighing daily and call for an overnight weight gain of 3 pounds or more or a weekly weight gain of more than 5 pounds.   If you have voicemail, please make sure your mailbox is cleaned out so that we may leave a message and please make sure to listen to any voicemails.   Get compression socks and put them on every morning with removal at bedtime.    Call [facility] neurology and get an appointment scheduled [phone]. Referral has been placed today.    Call [facility] or [facility] for a primary care appointment.   [facility] [address], [place], [place] [address] [address], [place], [place] [address] [phone]

## 2022-02-02 ENCOUNTER — Ambulatory Visit: Payer: BC Managed Care – PPO | Admitting: Family

## 2022-02-17 ENCOUNTER — Ambulatory Visit: Payer: BC Managed Care – PPO | Admitting: Medical

## 2022-03-03 ENCOUNTER — Emergency Department: Payer: BC Managed Care – PPO

## 2022-03-03 ENCOUNTER — Other Ambulatory Visit: Payer: Self-pay

## 2022-03-03 ENCOUNTER — Inpatient Hospital Stay
Admission: EM | Admit: 2022-03-03 | Discharge: 2022-03-06 | DRG: 074 | Disposition: A | Discharge status: Home / Self Care | Payer: BC Managed Care – PPO | Attending: Family Medicine | Admitting: Family Medicine

## 2022-03-03 DIAGNOSIS — I10 Essential (primary) hypertension: Secondary | ICD-10-CM

## 2022-03-03 DIAGNOSIS — R9089 Other abnormal findings on diagnostic imaging of central nervous system: Secondary | ICD-10-CM

## 2022-03-03 DIAGNOSIS — Z833 Family history of diabetes mellitus: Secondary | ICD-10-CM

## 2022-03-03 DIAGNOSIS — E785 Hyperlipidemia, unspecified: Secondary | ICD-10-CM

## 2022-03-03 DIAGNOSIS — E1142 Type 2 diabetes mellitus with diabetic polyneuropathy: Secondary | ICD-10-CM

## 2022-03-03 DIAGNOSIS — G500 Trigeminal neuralgia: Principal | ICD-10-CM | POA: Diagnosis present

## 2022-03-03 DIAGNOSIS — Z8673 Personal history of transient ischemic attack (TIA), and cerebral infarction without residual deficits: Secondary | ICD-10-CM

## 2022-03-03 DIAGNOSIS — I110 Hypertensive heart disease with heart failure: Secondary | ICD-10-CM | POA: Diagnosis present

## 2022-03-03 DIAGNOSIS — Z7902 Long term (current) use of antithrombotics/antiplatelets: Secondary | ICD-10-CM

## 2022-03-03 DIAGNOSIS — Z8249 Family history of ischemic heart disease and other diseases of the circulatory system: Secondary | ICD-10-CM

## 2022-03-03 DIAGNOSIS — R519 Headache, unspecified: Principal | ICD-10-CM

## 2022-03-03 DIAGNOSIS — Z794 Long term (current) use of insulin: Secondary | ICD-10-CM

## 2022-03-03 DIAGNOSIS — I69392 Facial weakness following cerebral infarction: Secondary | ICD-10-CM

## 2022-03-03 DIAGNOSIS — Z7982 Long term (current) use of aspirin: Secondary | ICD-10-CM

## 2022-03-03 DIAGNOSIS — Z79899 Other long term (current) drug therapy: Secondary | ICD-10-CM

## 2022-03-03 LAB — CBG MONITORING, ED: Glucose-Capillary: 137 mg/dL — ABNORMAL HIGH (ref 70–99)

## 2022-03-03 MED ORDER — HYDROMORPHONE HCL 1 MG/ML IJ SOLN
1.0000 mg | INTRAMUSCULAR | Status: AC
  Administered 2022-03-03: 1 mg via INTRAVENOUS
  Filled 2022-03-03: qty 1

## 2022-03-03 MED ORDER — POLYVINYL ALCOHOL 1.4 % OP SOLN
1.0000 [drp] | OPHTHALMIC | Status: DC | PRN
  Administered 2022-03-03: 1 [drp] via OPHTHALMIC
  Filled 2022-03-03: qty 15

## 2022-03-03 MED ORDER — LOSARTAN POTASSIUM 50 MG PO TABS
50.0000 mg | ORAL_TABLET | ORAL | Status: AC
Start: 2022-03-03 — End: 2022-03-03
  Administered 2022-03-03: 50 mg via ORAL
  Filled 2022-03-03: qty 1

## 2022-03-03 MED ORDER — FLUORESCEIN SODIUM 1 MG OP STRP
1.0000 | ORAL_STRIP | Freq: Once | OPHTHALMIC | Status: AC
  Administered 2022-03-03: 1 via OPHTHALMIC
  Filled 2022-03-03: qty 1

## 2022-03-03 NOTE — ED Provider Notes (Signed)
[facility] Provider Note    Event Date/Time   First MD Initiated Contact with Patient 03/03/22 1715     (approximate)   History   Facial Pain   HPI  Edwin Edwards is a 61 y.o. male who on review of neurology records has a history of trigeminal neuralgia but also a recent acute ischemic stroke in August.  Unfortunately, and notes from Dr. Malvin Johns are not yet complete from most recent visit, but what I am able to gather is that the patient is being treated for trigeminal neuralgia and also on aspirin daily for concerns that he also had an ischemic stroke.  Patient reports that he is taking gabapentin and carbamazepine.  He did not take his blood pressure medicine today though, and takes aspirin daily.  He came in today as for about the last 10 days the pain burning and sharp lancing pain over his right side of his face had gone away but it started coming back again today and where he will get sharp burning stinging pains over the right side of the face that last about 15 seconds and then go away off and on throughout the day  No new weakness or numbness.  No speech changes.  No difficulty walking or weakness in the arms or legs.  No headache.  When the pain is present and severe it will cause a slight blurring feeling around his right eye  He has chronic weakness over the right side of his face around his cheek since the time of his stroke which is unchanged  No neck pain.  No chest pain no difficulty breathing     Physical Exam   Triage Vital Signs: ED Triage Vitals  Enc Vitals Group     BP 03/03/22 1709 (!) 193/113     Pulse Rate 03/03/22 1709 89     Resp 03/03/22 1709 (!) 21     Temp 03/03/22 1709 98.6 F (37 C)     Temp src --      SpO2 03/03/22 1709 96 %     Weight --      Height --      Head Circumference --      Peak Flow --      Pain Score 03/03/22 1708 3     Pain Loc --      Pain Edu? --      Excl. in GC? --     Most recent vital  signs: Vitals:   03/03/22 2200 03/03/22 2337  BP: (!) 198/127 (!) 186/109  Pulse: 92 100  Resp: 18 18  Temp:  98.7 F (37.1 C)  SpO2: 99% 97%     General: Awake, no distress.  Very pleasant.  Normocephalic atraumatic.  He reports that he is not actively having any pain now but throughout the day today has had sharp lancinating episodes of pain and he is encircles an area around his right zygomatic region that runs back towards the front of his right ear.  There are no lesions present on his face or nose. CV:  Good peripheral perfusion.  Normal heart tones Resp:  Normal effort.  Speaks with full clear sentences without respiratory distress Abd:  No distention.  Other:  Moves all extremities with 5 out of 5 strength.  Has slight weakness in the right seventh facial nerve, appears to have a just a slight droopiness below the right eye and around the right edge of the mouth which she reports  has been present for a couple months.  No obvious new neurologic symptoms.  Extraocular movements are normal.  Eyes appear normal with clear cornea and normal conjunctiva bilateral.  Denies eye pain.   ED Results / Procedures / Treatments   Labs (all labs ordered are listed, but only abnormal results are displayed) Labs Reviewed  CBG MONITORING, ED - Abnormal; Notable for the following components:      Result Value   Glucose-Capillary 137 (*)    All other components within normal limits  CBC  BASIC METABOLIC PANEL     EKG  Interpreted by me at 1715 heart rate 80 QRS 90 QTc 420 Normal sinus rhythm no evidence of acute ischemia   RADIOLOGY  As the patient is previous symptoms of neuralgia have been associated with at least some context of an acute stroke in the past, I have ordered an MRI of the brain today to exclude acute new pathology including new stroke.   MR BRAIN WO CONTRAST  Result Date: 03/03/2022 CLINICAL DATA:  Right-sided facial weakness and pain EXAM: MRI HEAD WITHOUT  CONTRAST TECHNIQUE: Multiplanar, multiecho pulse sequences of the brain and surrounding structures were obtained without intravenous contrast. COMPARISON:  01/03/2022 FINDINGS: Brain: Decreased size and resolved diffusion signal at the presumed infarct at the right internal capsule seen on prior. No acute infarct, hemorrhage, hydrocephalus, or masslike finding. Negative for collection or atrophy. There is linear T2 hyperintensity in the bilateral brachium pontis/lateral pons, oriented along the trajectory of the trigeminal nerves, see coronal T2 weighted imaging and axial FLAIR images. Vascular: Major flow voids are preserved Skull and upper cervical spine: No acute finding Sinuses/Orbits: No acute finding IMPRESSION: 1. Symmetric abnormal signal in the brainstem oriented along the fibers of the trigeminal nerves, unusually focal and symmetric for demyelinating disease, question recent herpetic illness. Consider postcontrast assessment 2. No acute infarct. Electronically Signed   By: Tiburcio Pea M.D.   On: 03/03/2022 19:51      PROCEDURES:  Critical Care performed: No  Procedures   MEDICATIONS ORDERED IN ED: Medications  polyvinyl alcohol (LIQUIFILM TEARS) 1.4 % ophthalmic solution 1 drop (1 drop Both Eyes Given 03/03/22 2212)  losartan (COZAAR) tablet 50 mg (50 mg Oral Given 03/03/22 1737)  fluorescein ophthalmic strip 1 strip (1 strip Both Eyes Given 03/03/22 2056)  HYDROmorphone (DILAUDID) injection 1 mg (1 mg Intravenous Given 03/03/22 2329)     IMPRESSION / MDM / ASSESSMENT AND PLAN / ED COURSE  I reviewed the triage vital signs and the nursing notes.                              Differential diagnosis includes, but is not limited to, flare or recurrence of trigeminal neuralgia, and a chronic slight right-sided Bell's palsy also appears to be present and also wish to exclude central cause such as new stroke.  Overall my clinical impression is that this is likely a flare of his  Bell's palsy, he does not have evidence to support obvious new acute central neurologic or focal abnormality but given the association in the past of this diagnosis with an acute stroke we will obtain an MRI of the brain today.  His blood pressure is elevated but he is asymptomatic with regard to any sort of a global headache, confusion, cardiac or pulmonary symptoms.  He had not taken his blood pressure medicine today and I will provide this for him.  Patient is  not a thrombolytic candidate, he has had a previous acute stroke documented in the last 3 months including January 03, 2022 on MRI.  Patient's presentation is most consistent with acute complicated illness / injury requiring diagnostic workup.  The patient is on the cardiac monitor to evaluate for evidence of arrhythmia and/or significant heart rate changes.   Clinical Course as of 03/03/22 2343  Tue Mar 03, 2022  2218 Fluorescein stain utilized to evaluate the cornea, no fluorescein uptake, no dendrites.  Normal evaluation with Woods lamp in the eyes bilateral [MQ]  2219 No lesions noted over the face nose or other areas of the body.  No obvious herpetic findings [MQ]  2229 I have paged [facility] neurology to discuss case [MQ]  2229 Patient evaluated at this time, reports having another episode appears to be having severe right-sided facial pain, wincing in pain and appears quite in distress.  Lasting a total of about 20 seconds and then improving. [MQ]    Clinical Course User Index [MQ] Delman Kitten, MD   Despite the patient's compliance with his home medications, he is still having episodes of severe intermittent right-sided facial pain.  His blood pressure is also notably elevated and I suspect in part this may be due to the severity of his pain.  Nonetheless, discussed with patient and will anticipate admission and will trial opioid such as hydromorphone for intractable facial pains, and anticipate admission to the hospital.   He  does not have clear evidence of obvious herpetic nature or infectious viral cause at this point, given the chronicity I do not know the exact etiology of his pain but demyelinating illness is also considered.  At this point, I think he would benefit from an inpatient neurology consult which will be available tomorrow, pain control, blood pressure monitoring and management, and admission.  Ongoing care with plan to follow-up on pending labs including CBC metabolic panel and consultation with the hospitalist for admission assigned to oncoming partner Dr. Beather Arbour.  I have placed a consultation request to neurology Dr. Curly Shores, and informed her via inbox message as well for consult tomorrow.  Patient understanding very agreeable with the plan for admission.  FINAL CLINICAL IMPRESSION(S) / ED DIAGNOSES   Final diagnoses:  Facial pain  Abnormal finding on MRI of brain     Rx / DC Orders   ED Discharge Orders     None        Note:  This document was prepared using Dragon voice recognition software and may include unintentional dictation errors.   Delman Kitten, MD 03/03/22 [phone]

## 2022-03-03 NOTE — ED Triage Notes (Addendum)
Pt comes with c/o right sided facial pain and numbness. Pt was seen on 10-17 for trigeminal neuralgia and stroke. Pt was started on increased dosages of meds of carbamazepine and gabapentin.  Pt also states sob but no CP. Pt states some blurry vision that just started two hours ago. Pt states he was trying to read. Pt denies any numbness or tingling.

## 2022-03-03 NOTE — ED Notes (Signed)
Paged Lorrin Goodell, MD (neurology) [facility] for consult with Jacqualine Code, MD

## 2022-03-04 ENCOUNTER — Encounter: Payer: Self-pay | Admitting: Family Medicine

## 2022-03-04 ENCOUNTER — Observation Stay: Payer: BC Managed Care – PPO

## 2022-03-04 ENCOUNTER — Other Ambulatory Visit: Payer: Self-pay

## 2022-03-04 DIAGNOSIS — G500 Trigeminal neuralgia: Secondary | ICD-10-CM | POA: Diagnosis not present

## 2022-03-04 DIAGNOSIS — E1142 Type 2 diabetes mellitus with diabetic polyneuropathy: Secondary | ICD-10-CM | POA: Diagnosis not present

## 2022-03-04 DIAGNOSIS — E785 Hyperlipidemia, unspecified: Secondary | ICD-10-CM | POA: Diagnosis not present

## 2022-03-04 DIAGNOSIS — Z8673 Personal history of transient ischemic attack (TIA), and cerebral infarction without residual deficits: Secondary | ICD-10-CM

## 2022-03-04 DIAGNOSIS — I10 Essential (primary) hypertension: Secondary | ICD-10-CM

## 2022-03-04 LAB — CBC
HCT: 43.1 % (ref 39.0–52.0)
HCT: 46.6 % (ref 39.0–52.0)
Hemoglobin: 13.1 g/dL (ref 13.0–17.0)
Hemoglobin: 14.3 g/dL (ref 13.0–17.0)
MCH: 24.4 pg — ABNORMAL LOW (ref 26.0–34.0)
MCH: 24.5 pg — ABNORMAL LOW (ref 26.0–34.0)
MCHC: 30.4 g/dL (ref 30.0–36.0)
MCHC: 30.7 g/dL (ref 30.0–36.0)
MCV: 79.9 fL — ABNORMAL LOW (ref 80.0–100.0)
MCV: 80.3 fL (ref 80.0–100.0)
Platelets: 175 10*3/uL (ref 150–400)
Platelets: 206 10*3/uL (ref 150–400)
RBC: 5.37 MIL/uL (ref 4.22–5.81)
RBC: 5.83 MIL/uL — ABNORMAL HIGH (ref 4.22–5.81)
RDW: 14.5 % (ref 11.5–15.5)
RDW: 14.6 % (ref 11.5–15.5)
WBC: 3.9 10*3/uL — ABNORMAL LOW (ref 4.0–10.5)
WBC: 4.6 10*3/uL (ref 4.0–10.5)
nRBC: 0 % (ref 0.0–0.2)
nRBC: 0 % (ref 0.0–0.2)

## 2022-03-04 LAB — BASIC METABOLIC PANEL
Anion gap: 10 (ref 5–15)
Anion gap: 6 (ref 5–15)
BUN: 9 mg/dL (ref 6–20)
BUN: 8 mg/dL (ref 6–20)
CO2: 25 mmol/L (ref 22–32)
CO2: 28 mmol/L (ref 22–32)
Calcium: 9.3 mg/dL (ref 8.9–10.3)
Calcium: 9 mg/dL (ref 8.9–10.3)
Chloride: 102 mmol/L (ref 98–111)
Chloride: 105 mmol/L (ref 98–111)
Creatinine, Ser: 0.81 mg/dL (ref 0.61–1.24)
Creatinine, Ser: 0.85 mg/dL (ref 0.61–1.24)
GFR, Estimated: >60 mL/min (ref >60)
GFR, Estimated: >60 mL/min (ref >60)
Glucose, Bld: 198 mg/dL — ABNORMAL HIGH (ref 70–99)
Glucose, Bld: 182 mg/dL — ABNORMAL HIGH (ref 70–99)
Potassium: 3.9 mmol/L (ref 3.5–5.1)
Potassium: 3.9 mmol/L (ref 3.5–5.1)
Sodium: 137 mmol/L (ref 135–145)
Sodium: 139 mmol/L (ref 135–145)

## 2022-03-04 LAB — PHENYTOIN LEVEL, TOTAL: Phenytoin Lvl: <2.5 ug/mL — ABNORMAL LOW (ref 10.0–20.0)

## 2022-03-04 MED ORDER — SODIUM CHLORIDE 0.9 % IV SOLN: 1000.0000 mg | Freq: Once | INTRAVENOUS | Status: DC

## 2022-03-04 MED ORDER — ONDANSETRON HCL 4 MG PO TABS: 4.0000 mg | ORAL_TABLET | Freq: Four times a day (QID) | ORAL | Status: DC | PRN

## 2022-03-04 MED ORDER — ACETAMINOPHEN 325 MG PO TABS: 650.0000 mg | ORAL_TABLET | Freq: Four times a day (QID) | ORAL | Status: DC | PRN

## 2022-03-04 MED ORDER — GABAPENTIN 400 MG PO CAPS
800.0000 mg | ORAL_CAPSULE | Freq: Every day | ORAL | Status: DC
  Administered 2022-03-04: 800 mg via ORAL
  Filled 2022-03-04: qty 2

## 2022-03-04 MED ORDER — MORPHINE SULFATE (PF) 2 MG/ML IV SOLN
2.0000 mg | INTRAVENOUS | Status: DC | PRN
  Administered 2022-03-04 – 2022-03-05 (×7): 2 mg via INTRAVENOUS
  Filled 2022-03-04 (×7): qty 1

## 2022-03-04 MED ORDER — ENOXAPARIN SODIUM 80 MG/0.8ML IJ SOSY
0.5000 mg/kg | PREFILLED_SYRINGE | INTRAMUSCULAR | Status: DC
  Administered 2022-03-04 – 2022-03-05 (×2): 72.5 mg via SUBCUTANEOUS
  Filled 2022-03-04 (×2): qty 0.72

## 2022-03-04 MED ORDER — SODIUM CHLORIDE 0.9 % IV SOLN: INTRAVENOUS | Status: DC

## 2022-03-04 MED ORDER — MAGNESIUM HYDROXIDE 400 MG/5ML PO SUSP: 30.0000 mL | Freq: Every day | ORAL | Status: DC | PRN

## 2022-03-04 MED ORDER — PHENYTOIN SODIUM EXTENDED 100 MG PO CAPS
300.0000 mg | ORAL_CAPSULE | Freq: Two times a day (BID) | ORAL | Status: DC
  Administered 2022-03-04 – 2022-03-05 (×3): 300 mg via ORAL
  Filled 2022-03-04 (×3): qty 3

## 2022-03-04 MED ORDER — TRAZODONE HCL 50 MG PO TABS: 25.0000 mg | ORAL_TABLET | Freq: Every evening | ORAL | Status: DC | PRN

## 2022-03-04 MED ORDER — CARBAMAZEPINE ER 100 MG PO TB12
600.0000 mg | ORAL_TABLET | Freq: Two times a day (BID) | ORAL | Status: DC
  Administered 2022-03-05 – 2022-03-06 (×3): 600 mg via ORAL
  Filled 2022-03-04 (×2): qty 3
  Filled 2022-03-04 (×2): qty 6
  Filled 2022-03-04: qty 3

## 2022-03-04 MED ORDER — GABAPENTIN 400 MG PO CAPS
400.0000 mg | ORAL_CAPSULE | Freq: Two times a day (BID) | ORAL | Status: DC
  Administered 2022-03-05: 400 mg via ORAL
  Filled 2022-03-04 (×2): qty 1

## 2022-03-04 MED ORDER — ACETAMINOPHEN 650 MG RE SUPP: 650.0000 mg | Freq: Four times a day (QID) | RECTAL | Status: DC | PRN

## 2022-03-04 MED ORDER — SODIUM CHLORIDE 0.9 % IV SOLN
10.0000 mg/kg | Freq: Once | INTRAVENOUS | Status: AC
  Administered 2022-03-04: 845 mg via INTRAVENOUS
  Filled 2022-03-04: qty 16.9

## 2022-03-04 MED ORDER — GADOBUTROL 1 MMOL/ML IV SOLN
10.0000 mL | Freq: Once | INTRAVENOUS | Status: AC | PRN
  Administered 2022-03-04: 10 mL via INTRAVENOUS

## 2022-03-04 MED ORDER — ONDANSETRON HCL 4 MG/2ML IJ SOLN: 4.0000 mg | Freq: Four times a day (QID) | INTRAMUSCULAR | Status: DC | PRN

## 2022-03-04 NOTE — ED Notes (Signed)
Pt ambulates to restroom with steady gait.

## 2022-03-04 NOTE — Assessment & Plan Note (Signed)
-   We will continue statin therapy. 

## 2022-03-04 NOTE — Assessment & Plan Note (Signed)
-   The patient will be placed on supplemental coverage with NovoLog. - We will continue basal coverage. 

## 2022-03-04 NOTE — Assessment & Plan Note (Signed)
-   We will continue his antihypertensives. 

## 2022-03-04 NOTE — Care Plan (Signed)
This 61 years old male with PMH significant for CHF, type 2 diabetes, hypertension, history of CVA presented in the ED with acute onset of right facial pain that he describes as electric shock with associated tingling without numbness.  Patient denies any headache dizziness or blurring of vision.  Brain MRI was done which showed symmetrical abnormal signal in the brainstem oriented along with fibers of the trigeminal nerves unusually focal and symmetric for demyelinating disease.  Patient was admitted for trigeminal neuralgia.  Patient is started on IV phenytoin.  Neurology is consulted.  Patient was seen and examined at bedside.  He reports doing better.  Pain has improved.

## 2022-03-04 NOTE — Assessment & Plan Note (Signed)
-   We will continue aspirin and Plavix. 

## 2022-03-04 NOTE — Consult Note (Signed)
Neurology Consultation Reason for Consult: Refractory trigeminal neuralgia Requesting Physician: Shanda Howells   CC: Right facial pain  History is obtained from: Patient and chart review  HPI: Edwin Edwards is a 61 y.o. male with a past medical history significant for hypertension, hyperlipidemia, poorly controlled diabetes (A1c 8.2% 01/03/2022), silent CVA incidentally discovered without deficits (01/03/2022), trigeminal neuralgia.  He reports that he started have for headache facial pain a few months ago.  This is electric shocklike pain triggered by touch of the right jaw or movement of his face/jaw, including light touch such as wind blowing on his face, and inhibiting him from eating/drinking due to pain.  He was started on gabapentin without much improvement, and then started on carbamazepine by Dr. Melrose Nakayama outpatient.  This was initially effective with resolution of his pain for approximately 1 week.  However on Saturday he suddenly had recurrence of his pain waking up from sleep.  While in the hospital he feels he has had some relief from morphine but did not feel like he had much benefit from phenytoin  Interestingly he had left-sided identical symptoms several years ago in [place], which was relieved by removal of a tooth.  This time he has had no culprit tooth identified.  However he does not have any left-sided symptoms at this time  ROS: All other review of systems was negative except as noted in the HPI.   Past Medical History:  Diagnosis Date   CHF (congestive heart failure) (HCC)    Diabetes mellitus without complication ([place])    Hypertension    Stroke [facility])    Past Surgical History:  Procedure Laterality Date   ABDOMINAL SURGERY     COLON SURGERY     Family History  Problem Relation Age of Onset   Hypertension Mother    Diabetes Mother    Heart attack Mother    Social History:  reports that he has never smoked. He has never used smokeless tobacco. He reports that he  does not currently use alcohol. He reports that he does not currently use drugs.   Exam: Current vital signs: BP (!) 149/89   Pulse 83   Temp 98.4 F (36.9 C) (Oral)   Resp 14   Ht 6\' 3"  (1.905 m)   Wt (!) 145.2 kg   SpO2 97%   BMI 40.00 kg/m  Vital signs in last 24 hours: Temp:  [98.1 F (36.7 C)-98.7 F (37.1 C)] 98.4 F (36.9 C) (10/25 0532) Pulse Rate:  [77-100] 83 (10/25 0600) Resp:  [14-21] 14 (10/25 0600) BP: (148-204)/(81-127) 149/89 (10/25 0600) SpO2:  [94 %-99 %] 97 % (10/25 0600) Weight:  [145.2 kg] 145.2 kg (10/24 2337)   Physical Exam  Constitutional: Appears well-developed and well-nourished, though somewhat tired and in pain Psych: Affect appropriate to situation, pleasant and cooperative Eyes: No scleral injection HENT: No oropharyngeal obstruction.  MSK: no joint deformities.  Cardiovascular: Perfusing extremities well Respiratory: Effort normal, non-labored breathing GI: Soft.  No distension. There is no tenderness.  Skin: Warm dry and intact visible skin  Neuro: Mental Status: Patient is awake, alert, oriented to person, place, month, year, and situation. Patient is able to give a clear and coherent history. No signs of aphasia or neglect Cranial Nerves: II: Visual Fields are full. Pupils are equal, round, and reactive to light.   III,IV, VI: EOMI without ptosis or diploplia.  V: Facial sensation is symmetric to temperature, with elicitation of his pain by light touch of the right lower  face VII: Facial movement is guarded with reduced movement of the right face due to pain VIII: hearing is intact to voice and symmetric to tuning fork X: Uvula elevates symmetrically XI: Shoulder shrug is symmetric. XII: tongue is midline without atrophy or fasciculations.  Motor: Tone is normal. Bulk is normal. 5/5 strength was present in all four extremities.  Sensory: Sensation is symmetric to light touch and temperature in the arms and legs, with a length  dependent neuropathy, loss of vibration sensation at the toes and about 5 seconds of vibration sensation at the ankles Deep Tendon Reflexes: 2+ and symmetric in the brachioradialis and patellae.  Cerebellar: FNF and HKS are intact bilaterally Gait:  Deferred in acute setting    I have reviewed labs in epic and the results pertinent to this consultation are:  Basic Metabolic Panel: Recent Labs  Lab 03/04/22 0016 03/04/22 0535  NA 137 139  K 3.9 3.9  CL 102 105  CO2 25 28  GLUCOSE 198* 182*  BUN 9 8  CREATININE 0.81 0.85  CALCIUM 9.3 9.0    CBC: Recent Labs  Lab 03/04/22 0016 03/04/22 0535  WBC 4.6 3.9*  HGB 14.3 13.1  HCT 46.6 43.1  MCV 79.9* 80.3  PLT 206 175    Coagulation Studies: No results for input(s): "LABPROT", "INR" in the last 72 hours.  Lab Results  Component Value Date   CHOL 180 01/03/2022   HDL 43 01/03/2022   LDLCALC 116 (H) 01/03/2022   TRIG 105 01/03/2022   CHOLHDL 4.2 01/03/2022   Lab Results  Component Value Date   HGBA1C 8.2 (H) 01/03/2022   I have personally reviewed the images obtained:  MRI brain w/o 03/03/2022 1. Symmetric abnormal signal in the brainstem oriented along the fibers of the trigeminal nerves, unusually focal and symmetric for demyelinating disease, question recent herpetic illness. Consider postcontrast assessment [see coronal T2 weighted imaging and axial FLAIR images] 2. No acute infarct.  MRI brain w/ 03/03/2022 No abnormal enhancement is seen in the pons or along the bilateral trigeminal nerves. No abnormal enhancement along the bilateral seventh and eighth cranial nerves  Assessment: Symptoms are consistent with trigeminal neuralgia.  Given initial improvement with carbamazepine followed by relapse of symptoms of suspect tachyphylaxis and will attempt increasing his carbamazepine dose rather than switching to phenytoin as was initially suggested by my colleague via brief phone consultation.  His MRI brain  findings are interesting but in the absence of contrast enhancement and given unilateral symptoms at this time I wonder if it just reflects a remote viral infection that leaves him at high risk of nerve irritation.  If his symptoms are refractory to medication titration will consider reviewing these images with a neurosurgeon familiar with microvascular decompression techniques to query if he may be a surgical candidate but will hold off at this time given he has responded to medications recently  Recommendations: -Increase carbamazepine to 600 twice daily ER formulation from 400 twice daily  -Will not check a level as he did miss some doses while in the ED so this will not be accurate -Increase gabapentin from 600 twice daily to 600 in the morning and afternoon with 900 at night -Neurology will continue to follow  Brooke Dare MD-PhD [facility] [phone] [facility] coverage for [facility] is from 8 AM to 4 AM in-house and 4 PM to 8 PM by telephone/video. 8 PM to 8 AM emergent questions or overnight urgent questions should be addressed to Teleneurology On-call or  [facility]; contact information can be found on AMION

## 2022-03-04 NOTE — ED Notes (Signed)
Pt independently ambulatory to and from restroom without difficulty. Breakfast delivered by dietary but pt states he is in too much pain to eat. Pt medicated per MAR. Call light In reach, VSS, NAD. WCTM.

## 2022-03-04 NOTE — Assessment & Plan Note (Signed)
-   The patient will be admitted to an observation medical telemetry bed. - We will continue his Tegretol. - He will be loaded with IV Dilantin. - We will continue his Neurontin - Neurology consult will be obtained. -His brain MRI without contrast with contrast is currently pending.

## 2022-03-04 NOTE — H&P (Signed)
[place]   PATIENT NAME: Edwin Edwards    MR#:  [id number]  DATE OF BIRTH:  Oct 08, 1960  DATE OF ADMISSION:  03/03/2022  PRIMARY CARE PHYSICIAN: Patient, No Pcp Per   Patient is coming from: Home  REQUESTING/REFERRING PHYSICIAN: Lurline Hare, MD  CHIEF COMPLAINT:   Chief Complaint  Patient presents with   Facial Pain    HISTORY OF PRESENT ILLNESS:  Jeno Calleros is a 61 y.o. African-American male with medical history significant for CHF, type 2 diabetes mellitus, hypertension and CVA, who presented to the emergency room with acute onset of right facial pain that feels like electric shock with associated tingling without numbness.  Denies any other paresthesias or focal muscle weakness.  No headache or dizziness or blurred vision.  No chest pain or palpitations.  No nausea or vomiting or abdominal pain.  No dysuria, oliguria or hematuria or flank pain.  No cough or wheezing or dyspnea.  ED Course: Upon presentation to the emergency room, BP was 193/113 with a respiratory rate of 21 with otherwise normal vital signs.  Labs revealed a blood glucose of 198 with otherwise normal vital signs.  CBC showed microcytosis. EKG as reviewed by me : EKG showed normal sinus rhythm with a rate of 83. Imaging: Brain MRI without contrast revealed the following: 1. Symmetric abnormal signal in the brainstem oriented along the fibers of the trigeminal nerves, unusually focal and symmetric for demyelinating disease, question recent herpetic illness. Consider postcontrast assessment 2. No acute infarct.  The patient was given 1 mg of IV Dilaudid, 50 mg of p.o. Cozaar.  After discussion with neurology the recommendation was for IV phenytoin loading and brain MRI with contrast, the report of which is currently pending.  The patient will be admitted to an observation medical telemetry bed for further evaluation and management.  PAST MEDICAL HISTORY:   Past Medical History:  Diagnosis Date    CHF (congestive heart failure) (HCC)    Diabetes mellitus without complication ([place])    Hypertension    Stroke ([place])     PAST SURGICAL HISTORY:   Past Surgical History:  Procedure Laterality Date   ABDOMINAL SURGERY     COLON SURGERY      SOCIAL HISTORY:   Social History   Tobacco Use   Smoking status: Never   Smokeless tobacco: Never  Substance Use Topics   Alcohol use: Not Currently    FAMILY HISTORY:   Family History  Problem Relation Age of Onset   Hypertension Mother    Diabetes Mother    Heart attack Mother     DRUG ALLERGIES:  No Known Allergies  REVIEW OF SYSTEMS:   ROS As per history of present illness. All pertinent systems were reviewed above. Constitutional, HEENT, cardiovascular, respiratory, GI, GU, musculoskeletal, neuro, psychiatric, endocrine, integumentary and hematologic systems were reviewed and are otherwise negative/unremarkable except for positive findings mentioned above in the HPI.   MEDICATIONS AT HOME:   Prior to Admission medications   Medication Sig Start Date End Date Taking? Authorizing Provider  aspirin 81 MG chewable tablet Chew 1 tablet (81 mg total) by mouth daily. 01/06/22  Yes Fritzi Mandes, MD  atorvastatin (LIPITOR) 40 MG tablet Take 1 tablet (40 mg total) by mouth daily. 01/06/22  Yes Fritzi Mandes, MD  blood glucose meter kit and supplies Dispense based on patient and insurance preference. Use up to four times daily as directed. (FOR ICD-10 E10.9, E11.9). 01/05/22  Yes Fritzi Mandes, MD  carbamazepine (TEGRETOL-XR) 100 MG 12 hr tablet Take 1 tablet (100 mg total) by mouth 2 (two) times daily for 14 days. Patient taking differently: Take 200-400 mg by mouth 2 (two) times daily. 200 mg qam 400 mg qhs for 1 week and then take 400 mg bid 01/12/22  Yes Lucillie Garfinkel, MD  furosemide (LASIX) 20 MG tablet Take 2 tablets (40 mg total) by mouth daily. 01/05/22  Yes Fritzi Mandes, MD  gabapentin (NEURONTIN) 300 MG capsule Take 300-600 mg by mouth  2 (two) times daily. Take 300 mg qam and 600 mg qhs for 1 week then take 600 mg bid   Yes [provider]  insulin glargine (LANTUS SOLOSTAR) 100 UNIT/ML Solostar Pen Inject 40 Units into the skin 2 (two) times daily. Patient taking differently: Inject 40 Units into the skin daily. 01/05/22  Yes Fritzi Mandes, MD  Insulin Pen Needle (PEN NEEDLES 3/16") 31G X 5 MM MISC 1 Needle by Does not apply route 4 (four) times daily. 01/05/22  Yes Fritzi Mandes, MD  losartan (COZAAR) 50 MG tablet Take 1 tablet (50 mg total) by mouth daily. 01/06/22  Yes Fritzi Mandes, MD  clopidogrel (PLAVIX) 75 MG tablet Take 1 tablet (75 mg total) by mouth daily. Daily for 21 days only Patient not taking: Reported on 03/04/2022 01/06/22   Fritzi Mandes, MD      VITAL SIGNS:  Blood pressure (!) 152/90, pulse 88, temperature 98.5 F (36.9 C), temperature source Oral, resp. rate 18, height '6\' 3"'  (1.905 m), weight (!) 145.2 kg, SpO2 98 %.  PHYSICAL EXAMINATION:  Physical Exam  GENERAL:  61 y.o.-year-old African-American male patient lying in the bed with no acute distress.  EYES: Pupils equal, round, reactive to light and accommodation. No scleral icterus. Extraocular muscles intact.  HEENT: Head atraumatic, normocephalic. Oropharynx and nasopharynx clear.  NECK:  Supple, no jugular venous distention. No thyroid enlargement, no tenderness.  LUNGS: Normal breath sounds bilaterally, no wheezing, rales,rhonchi or crepitation. No use of accessory muscles of respiration.  CARDIOVASCULAR: Regular rate and rhythm, S1, S2 normal. No murmurs, rubs, or gallops.  ABDOMEN: Soft, nondistended, nontender. Bowel sounds present. No organomegaly or mass.  EXTREMITIES: No pedal edema, cyanosis, or clubbing.  NEUROLOGIC: Cranial nerves II through XII are intact. Muscle strength 5/5 in all extremities. Sensation intact. Gait not checked.  PSYCHIATRIC: The patient is alert and oriented x 3.  Normal affect and good eye contact. SKIN: No obvious  rash, lesion, or ulcer.   LABORATORY PANEL:   CBC Recent Labs  Lab 03/04/22 0016  WBC 4.6  HGB 14.3  HCT 46.6  PLT 206   ------------------------------------------------------------------------------------------------------------------  Chemistries  Recent Labs  Lab 03/04/22 0016  NA 137  K 3.9  CL 102  CO2 25  GLUCOSE 198*  BUN 9  CREATININE 0.81  CALCIUM 9.3   ------------------------------------------------------------------------------------------------------------------  Cardiac Enzymes No results for input(s): "TROPONINI" in the last 168 hours. ------------------------------------------------------------------------------------------------------------------  RADIOLOGY:  MR BRAIN WO CONTRAST  Result Date: 03/03/2022 CLINICAL DATA:  Right-sided facial weakness and pain EXAM: MRI HEAD WITHOUT CONTRAST TECHNIQUE: Multiplanar, multiecho pulse sequences of the brain and surrounding structures were obtained without intravenous contrast. COMPARISON:  01/03/2022 FINDINGS: Brain: Decreased size and resolved diffusion signal at the presumed infarct at the right internal capsule seen on prior. No acute infarct, hemorrhage, hydrocephalus, or masslike finding. Negative for collection or atrophy. There is linear T2 hyperintensity in the bilateral brachium pontis/lateral pons, oriented along the trajectory of the trigeminal nerves, see coronal  T2 weighted imaging and axial FLAIR images. Vascular: Major flow voids are preserved Skull and upper cervical spine: No acute finding Sinuses/Orbits: No acute finding IMPRESSION: 1. Symmetric abnormal signal in the brainstem oriented along the fibers of the trigeminal nerves, unusually focal and symmetric for demyelinating disease, question recent herpetic illness. Consider postcontrast assessment 2. No acute infarct. Electronically Signed   By: Jorje Guild M.D.   On: 03/03/2022 19:51      IMPRESSION AND PLAN:  Assessment and Plan: *  Trigeminal neuralgia - The patient will be admitted to an observation medical telemetry bed. - We will continue his Tegretol. - He will be loaded with IV Dilantin. - We will continue his Neurontin - Neurology consult will be obtained. -His brain MRI without contrast with contrast is currently pending.  Essential hypertension - We will continue his antihypertensives.  Type 2 diabetes mellitus with peripheral neuropathy (HCC) - The patient will be placed on supplemental coverage with NovoLog. - We will continue basal coverage.  History of CVA (cerebrovascular accident) - We will continue aspirin and Plavix.  Dyslipidemia - We will continue statin therapy.   DVT prophylaxis: Lovenox.  Advanced Care Planning:  Code Status: full code.  Family Communication:  The plan of care was discussed in details with the patient (and family). I answered all questions. The patient agreed to proceed with the above mentioned plan. Further management will depend upon hospital course. Disposition Plan: Back to previous home environment Consults called: Neurology All the records are reviewed and case discussed with ED provider.  Status is: Observation  I certify that at the time of admission, it is my clinical judgment that the patient will require  hospital care extending less than 2 midnights.                            Dispo: The patient is from: Home              Anticipated d/c is to: Home              Patient currently is not medically stable to d/c.              Difficult to place patient: No  Christel Mormon M.D on 03/04/2022 at 2:26 AM  [facility]   From 7 PM-7 AM, contact night-coverage [email or web address]  CC: Primary care physician; Patient, No Pcp Per

## 2022-03-04 NOTE — ED Notes (Signed)
Admitting Md at bedside

## 2022-03-05 DIAGNOSIS — E785 Hyperlipidemia, unspecified: Secondary | ICD-10-CM | POA: Diagnosis present

## 2022-03-05 DIAGNOSIS — Z7982 Long term (current) use of aspirin: Secondary | ICD-10-CM | POA: Diagnosis not present

## 2022-03-05 DIAGNOSIS — Z794 Long term (current) use of insulin: Secondary | ICD-10-CM | POA: Diagnosis not present

## 2022-03-05 DIAGNOSIS — I69392 Facial weakness following cerebral infarction: Secondary | ICD-10-CM | POA: Diagnosis not present

## 2022-03-05 DIAGNOSIS — G500 Trigeminal neuralgia: Secondary | ICD-10-CM | POA: Diagnosis present

## 2022-03-05 DIAGNOSIS — Z8249 Family history of ischemic heart disease and other diseases of the circulatory system: Secondary | ICD-10-CM | POA: Diagnosis not present

## 2022-03-05 DIAGNOSIS — Z833 Family history of diabetes mellitus: Secondary | ICD-10-CM | POA: Diagnosis not present

## 2022-03-05 DIAGNOSIS — I110 Hypertensive heart disease with heart failure: Secondary | ICD-10-CM | POA: Diagnosis present

## 2022-03-05 DIAGNOSIS — E1142 Type 2 diabetes mellitus with diabetic polyneuropathy: Secondary | ICD-10-CM | POA: Diagnosis present

## 2022-03-05 DIAGNOSIS — Z79899 Other long term (current) drug therapy: Secondary | ICD-10-CM | POA: Diagnosis not present

## 2022-03-05 DIAGNOSIS — Z7902 Long term (current) use of antithrombotics/antiplatelets: Secondary | ICD-10-CM | POA: Diagnosis not present

## 2022-03-05 LAB — BASIC METABOLIC PANEL
Anion gap: 7 (ref 5–15)
BUN: 9 mg/dL (ref 6–20)
CO2: 25 mmol/L (ref 22–32)
Calcium: 8.8 mg/dL — ABNORMAL LOW (ref 8.9–10.3)
Chloride: 107 mmol/L (ref 98–111)
Creatinine, Ser: 0.87 mg/dL (ref 0.61–1.24)
GFR, Estimated: >60 mL/min (ref >60)
Glucose, Bld: 164 mg/dL — ABNORMAL HIGH (ref 70–99)
Potassium: 3.9 mmol/L (ref 3.5–5.1)
Sodium: 139 mmol/L (ref 135–145)

## 2022-03-05 MED ORDER — GABAPENTIN 300 MG PO CAPS
1200.0000 mg | ORAL_CAPSULE | Freq: Every day | ORAL | Status: DC
  Administered 2022-03-05: 1200 mg via ORAL
  Filled 2022-03-05: qty 4

## 2022-03-05 MED ORDER — LOSARTAN POTASSIUM 50 MG PO TABS
50.0000 mg | ORAL_TABLET | Freq: Every day | ORAL | Status: DC
  Administered 2022-03-05 – 2022-03-06 (×2): 50 mg via ORAL
  Filled 2022-03-05 (×2): qty 1

## 2022-03-05 MED ORDER — ENSURE ENLIVE PO LIQD
237.0000 mL | Freq: Three times a day (TID) | ORAL | Status: DC
  Administered 2022-03-05 – 2022-03-06 (×3): 237 mL via ORAL

## 2022-03-05 MED ORDER — ADULT MULTIVITAMIN W/MINERALS CH
1.0000 | ORAL_TABLET | Freq: Every day | ORAL | Status: DC
  Administered 2022-03-05 – 2022-03-06 (×2): 1 via ORAL
  Filled 2022-03-05 (×2): qty 1

## 2022-03-05 MED ORDER — CLOPIDOGREL BISULFATE 75 MG PO TABS
75.0000 mg | ORAL_TABLET | Freq: Every day | ORAL | Status: DC
  Administered 2022-03-05 – 2022-03-06 (×2): 75 mg via ORAL
  Filled 2022-03-05 (×2): qty 1

## 2022-03-05 MED ORDER — GABAPENTIN 300 MG PO CAPS
600.0000 mg | ORAL_CAPSULE | Freq: Two times a day (BID) | ORAL | Status: DC
  Administered 2022-03-05 – 2022-03-06 (×2): 600 mg via ORAL
  Filled 2022-03-05 (×2): qty 2

## 2022-03-05 MED ORDER — FUROSEMIDE 40 MG PO TABS
40.0000 mg | ORAL_TABLET | Freq: Every day | ORAL | Status: DC
  Administered 2022-03-05 – 2022-03-06 (×2): 40 mg via ORAL
  Filled 2022-03-05 (×2): qty 1

## 2022-03-05 MED ORDER — ASPIRIN 81 MG PO CHEW
81.0000 mg | CHEWABLE_TABLET | Freq: Every day | ORAL | Status: DC
  Administered 2022-03-05 – 2022-03-06 (×2): 81 mg via ORAL
  Filled 2022-03-05 (×2): qty 1

## 2022-03-05 MED ORDER — ATORVASTATIN CALCIUM 20 MG PO TABS
40.0000 mg | ORAL_TABLET | Freq: Every day | ORAL | Status: DC
  Administered 2022-03-05 – 2022-03-06 (×2): 40 mg via ORAL
  Filled 2022-03-05 (×2): qty 2

## 2022-03-05 NOTE — Progress Notes (Signed)
Mobility Specialist - Progress Note   03/05/22 0952  Mobility  Activity Ambulated independently to bathroom  Level of Assistance Independent  Assistive Device None  Distance Ambulated (ft) 10 ft  Activity Response Tolerated well  Mobility Referral Yes  $Mobility charge 1 Mobility   Pt supine upon entry, utilizing RA. Pt ambulated to the bathroom without AD, tolerated well. Pt returned to bed, left supine with needs within reach.   Candie [place] Mobility Specialist 03/05/22 9:55 AM

## 2022-03-05 NOTE — Progress Notes (Signed)
Initial Nutrition Assessment  DOCUMENTATION CODES:   Morbid obesity  INTERVENTION:   -Ensure Enlive po TID, each supplement provides 350 kcal and 20 grams of protein -MVI with minerals daily -Downgrade diet to dysphagia 3 diet with thin liquids for ease of intake -48 hour calorie count per MD  NUTRITION DIAGNOSIS:   Inadequate oral intake related to poor appetite (facial pain) as evidenced by per patient/family report.  GOAL:   Patient will meet greater than or equal to 90% of their needs  MONITOR:   PO intake, Supplement acceptance  REASON FOR ASSESSMENT:   Consult Calorie Count  ASSESSMENT:   Pt with PMH significant for CHF, type 2 diabetes, hypertension, history of CVA presented with acute onset of right facial pain that he describes as electric shock with associated tingling without numbness.  Patient denies any headache, dizziness or blurring of vision.  Pt admitted with trigeminal neuralgia.   Reviewed I/O's: +1.2 L x 24 hours and +1.3 L since admission   Spoke with pt at bedside, who was pleasant and in good spirits today. He shares that he has had intermittent issues with poor oral intake over the past 2 months secondary to facial pain. Per pt, when his facial pain flares up he will often go several days without eating. He has been avoiding cold foods and drinks, as this will flare up the pain. When the pain is not present, pt able to eat and drink adequately, but states he has also been incorporating mango protein shakes form [employer] to help supplement his intake.   Observed lunch tray- pt consumed entire tray with exception of side salad (711 kcals, 32 grams protein). Pt denies any facial pain now (he shares when it flares he is unable to eat or talk at all). Discussed importance of good meal and supplement intake to promote healing. Pt thinks softer textured foods will be helpful for ease of intake, so amenable to diet downgrade as well as oral nutrition supplements  to help meet his needs. RD informed pt purpose of calorie count. He had no further questions, but appreciative of RD visit.   Pt denies any weight loss. Reviewed wt hx; no wt loss noted.   10/25-10/26 Breakfast: 491 kcals, 20 grams protein Lunch: 622 kcals, 20 grams protein Dinner: 431 kcals 23 grams protein  Total intake: 1544 kcal (69% of minimum estimated needs)  63 grams protein (48% of minimum estimated needs)  Medications reviewed.    Lab Results  Component Value Date   HGBA1C 8.2 (H) 01/03/2022   PTA DM medications are 40 units insulin glargine daily.   Labs reviewed: CBGS: 137 (inpatient orders for glycemic control are none).    NUTRITION - FOCUSED PHYSICAL EXAM:  Flowsheet Row Most Recent Value  Orbital Region No depletion  Upper Arm Region No depletion  Thoracic and Lumbar Region No depletion  Buccal Region No depletion  Temple Region No depletion  Clavicle Bone Region No depletion  Clavicle and Acromion Bone Region No depletion  Scapular Bone Region No depletion  Dorsal Hand No depletion  Patellar Region No depletion  Anterior Thigh Region No depletion  Posterior Calf Region No depletion  Edema (RD Assessment) Mild  Hair Reviewed  Eyes Reviewed  Mouth Reviewed  Skin Reviewed  Nails Reviewed       Diet Order:   Diet Order             DIET DYS 3 Room service appropriate? Yes; Fluid consistency: Thin  Diet effective  now                   EDUCATION NEEDS:   Education needs have been addressed  Skin:  Skin Assessment: Reviewed RN Assessment  Last BM:  03/03/22  Height:   Ht Readings from Last 1 Encounters:  03/03/22 6\' 3"  (1.905 m)    Weight:   Wt Readings from Last 1 Encounters:  03/03/22 (!) 145.2 kg    Ideal Body Weight:  89.1 kg  BMI:  Body mass index is 40 kg/m.  Estimated Nutritional Needs:   Kcal:  03/05/22  Protein:  130-145 grams  Fluid:  > 2 L    [id number], RD, LDN, CDCES Registered Dietitian  II Certified Diabetes Care and Education Specialist Please refer to [facility] for RD and/or RD on-call/weekend/after hours pager

## 2022-03-05 NOTE — Progress Notes (Signed)
Neurology Progress Note  Patient ID: Edwin Edwards is a 61 y.o. with PMHx of  has a past medical history of CHF (congestive heart failure) ([place]), Diabetes mellitus without complication ([place]), Hypertension, and Stroke ([place]).  Subjective: Continues to have severe paroxysms of pain, but comfortable at the time of my evaluation Breakfast tray at bedside shows he has eaten all but half of his oatmeal  Exam: Vitals:   03/05/22 0427 03/05/22 0712  BP: (!) 166/99 (!) 154/96  Pulse: 85 79  Resp: 20 16  Temp: 97.6 F (36.4 C) (!) 96.9 F (36.1 C)  SpO2: 95% 99%   Gen: In bed, comfortable  Resp: non-labored breathing, no grossly audible wheezing Cardiac: Perfusing extremities well  Abd: soft, nt  Neuro: MS: Awake, alert, appropriately conversant with fluent speech and following simple commands CN: Improve movement of the right face today, smile only slightly tenuous on the right side Motor: Using all 4 extremities grossly equally  Pertinent Labs:  Basic Metabolic Panel: Recent Labs  Lab 03/04/22 0016 03/04/22 0535 03/05/22 0526  NA 137 139 139  K 3.9 3.9 3.9  CL 102 105 107  CO2 25 28 25   GLUCOSE 198* 182* 164*  BUN 9 8 9   CREATININE 0.81 0.85 0.87  CALCIUM 9.3 9.0 8.8*    CBC: Recent Labs  Lab 03/04/22 0016 03/04/22 0535  WBC 4.6 3.9*  HGB 14.3 13.1  HCT 46.6 43.1  MCV 79.9* 80.3  PLT 206 175    Coagulation Studies: No results for input(s): "LABPROT", "INR" in the last 72 hours.    Impression: Uncontrolled symptoms of trigeminal neuralgia although it is encouraging that patient is tolerating p.o. intake at least as evidenced by his breakfast tray observed by myself this morning.  I discussed with the patient that if his oral intake is adequate, we may be able to discharge him tomorrow morning with close outpatient follow-up with Dr. Melrose Nakayama for carbamazepine levels  Recommendations: -Increase gabapentin from 400/400/900 to 600/600/1200 -Continue carbamazepine  600 twice daily (has only received 1 dose at the time of my evaluation this morning, and did miss some doses while in the ED) -Calorie count to confirm adequate oral intake -I will also reach out to neurosurgery for potential microvascular decompression -Neurology will continue to follow  [place] [phone]  [facility] coverage for [facility] is from 8 AM to 4 AM in-house and 4 PM to 8 PM by telephone/video. 8 PM to 8 AM emergent questions or overnight urgent questions should be addressed to Teleneurology On-call or [facility] neurohospitalist; contact information can be found on AMION

## 2022-03-05 NOTE — Progress Notes (Signed)
PROGRESS NOTE    Edwin Edwards  ZSW:[id number] DOB: December 02, 1960 DOA: 03/03/2022 PCP: Patient, No Pcp Per    Brief Narrative:  This 61 years old male with PMH significant for CHF, type 2 diabetes, hypertension, history of CVA presented in the ED with acute onset of right facial pain that he describes as electric shock with associated tingling without numbness.  Patient denies any headache, dizziness or blurring of vision.  Brain MRI was done which showed symmetrical abnormal signal in the brainstem oriented along with fibers of the trigeminal nerves unusually focal and symmetric for demyelinating disease.  Patient was admitted for trigeminal neuralgia.  Patient was started on IV phenytoin.  Neurology was consulted.   Assessment & Plan:   Principal Problem:   Trigeminal neuralgia Active Problems:   Dyslipidemia   History of CVA (cerebrovascular accident)   Type 2 diabetes mellitus with peripheral neuropathy (HCC)   Essential hypertension   Trigeminal neuralgia: Patient presented with symptoms consistent with trigeminal neuralgia. Patient has initially improved with carbamazepine, followed by relapse of symptoms.   Neurology consulted suggested increasing the dose of carbamazepine. Phenytoin was discontinued.  This could be secondary to remote viral infection. Patient reports worsening symptoms. If his symptoms are refractory to medication titration consider discussion with neurosurgeon. Continue carbamazepine 600 mg twice daily. Continue gabapentin 600 mg twice daily and 900 mg at bedtime.  Essential hypertension: Continue losartan and Lasix.  Type 2 diabetes with peripheral neuropathy: Continue gabapentin and regular insulin sliding scale.   Hold p.o. diabetic medications  History of CVA: Continue aspirin, Plavix and Lipitor  Hyperlipidemia: Continue Lipitor  DVT prophylaxis: Lovenox Code Status: Full code Family Communication: No family at bedside Disposition  Plan:  Status is: Observation The patient remains OBS appropriate and will d/c before 2 midnights.   Patient was admitted for trigeminal neuralgia, was started on phenytoin and then discontinued carbamazepine and gabapentin increased.  Patient report worsening symptoms.  Patient is not medically cleared given worsening of symptoms  Consultants:  Neurology  Procedures: MRI Antimicrobials: None  Subjective: Patient was seen and examined at bedside.  Overnight events noted.   Patient reported worsening symptoms and states he could not sleep last night because of worsening pain.  Objective: Vitals:   03/04/22 2040 03/05/22 0040 03/05/22 0427 03/05/22 0712  BP: (!) 154/95 (!) 171/98 (!) 166/99 (!) 154/96  Pulse: 89 87 85 79  Resp: 18 18 20 16   Temp: 98.2 F (36.8 C) 97.9 F (36.6 C) 97.6 F (36.4 C) (!) 96.9 F (36.1 C)  TempSrc: Oral     SpO2: 99% 97% 95% 99%  Weight:      Height:        Intake/Output Summary (Last 24 hours) at 03/05/2022 1024 Last data filed at 03/04/2022 1900 Gross per 24 hour  Intake 1186.38 ml  Output --  Net 1186.38 ml   Filed Weights   03/03/22 2337  Weight: (!) 145.2 kg    Examination:  General exam: Appears comfortable, not in any acute distress. Respiratory system: CTA bilaterally, respiratory effort normal, RR 15 Cardiovascular system: S1 & S2 heard, regular rate and rhythm, no murmur. Gastrointestinal system: Abdomen is soft, non tender, non distended, BS+ Central nervous system: Alert and oriented x 3. No focal neurological deficits.   Numbness on the right side of face Extremities: No edema, no cyanosis, no clubbing Skin: No rashes, lesions or ulcers Psychiatry: Judgement and insight appear normal. Mood & affect appropriate.     Data Reviewed:  I have personally reviewed following labs and imaging studies  CBC: Recent Labs  Lab 03/04/22 0016 03/04/22 0535  WBC 4.6 3.9*  HGB 14.3 13.1  HCT 46.6 43.1  MCV 79.9* 80.3  PLT  206 175   Basic Metabolic Panel: Recent Labs  Lab 03/04/22 0016 03/04/22 0535 03/05/22 0526  NA 137 139 139  K 3.9 3.9 3.9  CL 102 105 107  CO2 25 28 25   GLUCOSE 198* 182* 164*  BUN 9 8 9   CREATININE 0.81 0.85 0.87  CALCIUM 9.3 9.0 8.8*   GFR: Estimated Creatinine Clearance: 139 mL/min (by C-G formula based on SCr of 0.87 mg/dL). Liver Function Tests: No results for input(s): "AST", "ALT", "ALKPHOS", "BILITOT", "PROT", "ALBUMIN" in the last 168 hours. No results for input(s): "LIPASE", "AMYLASE" in the last 168 hours. No results for input(s): "AMMONIA" in the last 168 hours. Coagulation Profile: No results for input(s): "INR", "PROTIME" in the last 168 hours. Cardiac Enzymes: No results for input(s): "CKTOTAL", "CKMB", "CKMBINDEX", "TROPONINI" in the last 168 hours. BNP (last 3 results) No results for input(s): "PROBNP" in the last 8760 hours. HbA1C: No results for input(s): "HGBA1C" in the last 72 hours. CBG: Recent Labs  Lab 03/03/22 1739  GLUCAP 137*   Lipid Profile: No results for input(s): "CHOL", "HDL", "LDLCALC", "TRIG", "CHOLHDL", "LDLDIRECT" in the last 72 hours. Thyroid Function Tests: No results for input(s): "TSH", "T4TOTAL", "FREET4", "T3FREE", "THYROIDAB" in the last 72 hours. Anemia Panel: No results for input(s): "VITAMINB12", "FOLATE", "FERRITIN", "TIBC", "IRON", "RETICCTPCT" in the last 72 hours. Sepsis Labs: No results for input(s): "PROCALCITON", "LATICACIDVEN" in the last 168 hours.  No results found for this or any previous visit (from the past 240 hour(s)).   Radiology Studies: MR BRAIN W CONTRAST  Result Date: 03/04/2022 CLINICAL DATA:  Right-sided facial weakness and pain, vasculitis suspected EXAM: MRI HEAD WITH CONTRAST TECHNIQUE: Multiplanar, multiecho pulse sequences of the brain and surrounding structures were obtained with intravenous contrast. CONTRAST:  61mL GADAVIST GADOBUTROL 1 MMOL/ML IV SOLN COMPARISON:  03/03/2022 MRI head  FINDINGS: Brain: No abnormal enhancement is seen in the pons or along the bilateral trigeminal nerves. No definite abnormal enhancement along the bilateral seventh and eighth cranial nerves. Redemonstrated presumed infarct in the right internal capsule. No acute hemorrhage, mass, mass effect, or midline shift. Abnormal parenchymal or meningeal enhancement. No hydrocephalus or extra-axial collection. Vascular: Normal proximal arterial flow voids. Skull and upper cervical spine: Normal marrow signal. Sinuses/Orbits: No acute finding. Other: None. IMPRESSION: No abnormal enhancement is seen in the pons or along the bilateral trigeminal nerves. No abnormal enhancement along the bilateral seventh and eighth cranial nerves. Electronically Signed   By: 9m M.D.   On: 03/04/2022 04:04   MR BRAIN WO CONTRAST  Result Date: 03/03/2022 CLINICAL DATA:  Right-sided facial weakness and pain EXAM: MRI HEAD WITHOUT CONTRAST TECHNIQUE: Multiplanar, multiecho pulse sequences of the brain and surrounding structures were obtained without intravenous contrast. COMPARISON:  01/03/2022 FINDINGS: Brain: Decreased size and resolved diffusion signal at the presumed infarct at the right internal capsule seen on prior. No acute infarct, hemorrhage, hydrocephalus, or masslike finding. Negative for collection or atrophy. There is linear T2 hyperintensity in the bilateral brachium pontis/lateral pons, oriented along the trajectory of the trigeminal nerves, see coronal T2 weighted imaging and axial FLAIR images. Vascular: Major flow voids are preserved Skull and upper cervical spine: No acute finding Sinuses/Orbits: No acute finding IMPRESSION: 1. Symmetric abnormal signal in the brainstem oriented along the  fibers of the trigeminal nerves, unusually focal and symmetric for demyelinating disease, question recent herpetic illness. Consider postcontrast assessment 2. No acute infarct. Electronically Signed   By: Jorje Guild M.D.    On: 03/03/2022 19:51    Scheduled Meds:  aspirin  81 mg Oral Daily   atorvastatin  40 mg Oral Daily   carbamazepine  600 mg Oral BID   clopidogrel  75 mg Oral Daily   enoxaparin (LOVENOX) injection  0.5 mg/kg Subcutaneous Q24H   furosemide  40 mg Oral Daily   gabapentin  800 mg Oral QHS   And   gabapentin  400 mg Oral BID   losartan  50 mg Oral Daily   Continuous Infusions:  sodium chloride 100 mL/hr at 03/04/22 1523     LOS: 0 days    Time spent: 50 mins    Paulena Servais, MD [facility]   If 7PM-7AM, please contact night-coverage

## 2022-03-05 NOTE — Progress Notes (Signed)
Rounded on patient, who stated pain had come down to about a 7/10. Denies need for pain medication at this time.

## 2022-03-05 NOTE — Progress Notes (Signed)
       CROSS COVER NOTE  NAME: Sekai Gitlin MRN: [id number] DOB : Oct 28, 1960       HPI/Events of Note   Patient requesting IV fluids be discontinued. Renal function normal. BP good. Blood sugars stable. Eating and drinking well  Assessment and  Interventions   Assessment:  Plan: Discontinue maintenance IV fluids       Kathlene Cote NP [facility]

## 2022-03-05 NOTE — Progress Notes (Signed)
Rounded on patient. C/o facial pain 10/10. Does not wish to have morphine administered at this time.

## 2022-03-05 NOTE — Plan of Care (Signed)

## 2022-03-06 DIAGNOSIS — G500 Trigeminal neuralgia: Secondary | ICD-10-CM | POA: Diagnosis not present

## 2022-03-06 LAB — BASIC METABOLIC PANEL
Anion gap: 7 (ref 5–15)
BUN: 15 mg/dL (ref 6–20)
CO2: 27 mmol/L (ref 22–32)
Calcium: 9.1 mg/dL (ref 8.9–10.3)
Chloride: 106 mmol/L (ref 98–111)
Creatinine, Ser: 0.96 mg/dL (ref 0.61–1.24)
GFR, Estimated: >60 mL/min (ref >60)
Glucose, Bld: 176 mg/dL — ABNORMAL HIGH (ref 70–99)
Potassium: 4.2 mmol/L (ref 3.5–5.1)
Sodium: 140 mmol/L (ref 135–145)

## 2022-03-06 MED ORDER — INSULIN ASPART 100 UNIT/ML IJ SOLN: 0.0000 [IU] | Freq: Three times a day (TID) | INTRAMUSCULAR | Status: DC

## 2022-03-06 MED ORDER — INSULIN ASPART 100 UNIT/ML IJ SOLN: 0.0000 [IU] | Freq: Every day | INTRAMUSCULAR | Status: DC

## 2022-03-06 MED ORDER — CARBAMAZEPINE ER 200 MG PO TB12: 600.0000 mg | ORAL_TABLET | Freq: Two times a day (BID) | ORAL | 0 refills | Status: AC

## 2022-03-06 MED ORDER — GABAPENTIN 300 MG PO CAPS: ORAL_CAPSULE | ORAL | 0 refills | Status: AC

## 2022-03-06 NOTE — Discharge Summary (Signed)
Physician Discharge Summary  Edwin Edwards [id number]:[id number] DOB: [birth year] DOA: 03/03/2022  PCP: Patient, No Pcp Per  Admit date: 03/03/2022  Discharge date: 03/06/2022  Admitted From: Home.  Disposition: Home.  Recommendations for Outpatient Follow-up:  Follow up with PCP in 1-2 weeks. Please obtain BMP/CBC in one week Advised to follow-up with Neurology in [facility]. Advised to take carbamazepine 600 mg twice daily for trigeminal neuralgia. Advised to take gabapentin 600 mg in the morning, 600 mg in the lunch and 1200 mg at bedtime for trigeminal neuralgia.  Home Health: None Equipment/Devices: None  Discharge Condition: Stable CODE STATUS: Full code Diet recommendation: Heart Healthy   Brief [facility] Course: This 61 years old male with PMH significant for CHF, type 2 diabetes, hypertension, history of CVA presented in the ED with acute onset of right facial pain that he describes it as electric shock with associated tingling without numbness.  Patient denies any headache, dizziness or blurring of vision.  Brain MRI was done which showed symmetrical abnormal signal in the brainstem oriented along with fibers of the trigeminal nerves unusually focal and symmetric for demyelinating disease. Patient was admitted for trigeminal neuralgia.  Patient was started on IV phenytoin.  Neurology was consulted.  Since patient has good response in the past to carbamazepine.  Phenytoin was discontinued and was started on carbamazepine higher dose, his gabapentin dose was also increased.  Patient has significant improvement in symptoms.  He reports almost resolution of pain and numbness.  He is smiling, eating well, denies any pain while chewing food.  Neurology signed off.  Advised patient to continue current medication at current dosing.  Advised patient to follow-up with neurology after discharge in 1 month.  Patient is being discharged home.  Discharge Diagnoses:  Principal  Problem:   Trigeminal neuralgia Active Problems:   Dyslipidemia   History of CVA (cerebrovascular accident)   Type 2 diabetes mellitus with peripheral neuropathy (HCC)   Essential hypertension    Trigeminal neuralgia: Patient presented with symptoms consistent with trigeminal neuralgia. Patient has initially improved with carbamazepine, followed by relapse of symptoms.   Neurology consulted suggested increasing the dose of carbamazepine. Phenytoin was discontinued.  This could be secondary to remote viral infection. Patient reports worsening symptoms. Medication doses were increased. If his symptoms are refractory to medication titration consider discussion with neurosurgeon. Continue carbamazepine 600 mg twice daily. Increase gabapentin 600 mg twice daily and 1200 mg at bedtime. Patient reported complete resolution of symptoms,  feels much improved.   Essential hypertension: Continue losartan and Lasix.   Type 2 diabetes with peripheral neuropathy: Continue gabapentin and regular insulin sliding scale.   Resume metformin.   History of CVA: Continue aspirin, Plavix and Lipitor   Hyperlipidemia: Continue Lipitor   Discharge Instructions  Discharge Instructions     Call MD for:  difficulty breathing, headache or visual disturbances   Complete by: As directed    Call MD for:  persistant dizziness or light-headedness   Complete by: As directed    Call MD for:  persistant nausea and vomiting   Complete by: As directed    Diet - low sodium heart healthy   Complete by: As directed    Diet Carb Modified   Complete by: As directed    Discharge instructions   Complete by: As directed    Advised to follow-up with primary care physician in 1 week. Advised to follow-up with neurology as scheduled. Advised to take carbamazepine 600 mg twice daily for trigeminal  neuralgia Advised to take gabapentin 600 mg in the morning, 600 mg in the lunch and 200 mg at bedtime for trigeminal  neuralgia. Advised to follow-up with neuro surgery as scheduled   Increase activity slowly   Complete by: As directed       Allergies as of 03/06/2022   No Known Allergies      Medication List     TAKE these medications    aspirin 81 MG chewable tablet Chew 1 tablet (81 mg total) by mouth daily.   atorvastatin 40 MG tablet Commonly known as: LIPITOR Take 1 tablet (40 mg total) by mouth daily.   blood glucose meter kit and supplies Dispense based on patient and insurance preference. Use up to four times daily as directed. (FOR ICD-10 E10.9, E11.9).   carbamazepine 200 MG 12 hr tablet Commonly known as: TEGRETOL XR Take 3 tablets (600 mg total) by mouth 2 (two) times daily. What changed:  medication strength how much to take   clopidogrel 75 MG tablet Commonly known as: PLAVIX Take 1 tablet (75 mg total) by mouth daily. Daily for 21 days only   furosemide 20 MG tablet Commonly known as: Lasix Take 2 tablets (40 mg total) by mouth daily.   gabapentin 300 MG capsule Commonly known as: NEURONTIN Advised to take gabapentin 600 mg in the morning, 600 mg in the lunch and 1200 mg at bedtime. What changed:  how much to take how to take this when to take this additional instructions   Lantus SoloStar 100 UNIT/ML Solostar Pen Generic drug: insulin glargine Inject 40 Units into the skin 2 (two) times daily. What changed: when to take this   losartan 50 MG tablet Commonly known as: COZAAR Take 1 tablet (50 mg total) by mouth daily.   Pen Needles 3/16" 31G X 5 MM Misc 1 Needle by Does not apply route 4 (four) times daily.        Follow-up Information     [facility]. Go in 4 week(s).   Why: Patient will need to call for appointment Contact information: [place] [phone]               No Known Allergies  Consultations: Neurology   Procedures/Studies: MR BRAIN W  CONTRAST  Result Date: 03/04/2022 CLINICAL DATA:  Right-sided facial weakness and pain, vasculitis suspected EXAM: MRI HEAD WITH CONTRAST TECHNIQUE: Multiplanar, multiecho pulse sequences of the brain and surrounding structures were obtained with intravenous contrast. CONTRAST:  38m GADAVIST GADOBUTROL 1 MMOL/ML IV SOLN COMPARISON:  03/03/2022 MRI head FINDINGS: Brain: No abnormal enhancement is seen in the pons or along the bilateral trigeminal nerves. No definite abnormal enhancement along the bilateral seventh and eighth cranial nerves. Redemonstrated presumed infarct in the right internal capsule. No acute hemorrhage, mass, mass effect, or midline shift. Abnormal parenchymal or meningeal enhancement. No hydrocephalus or extra-axial collection. Vascular: Normal proximal arterial flow voids. Skull and upper cervical spine: Normal marrow signal. Sinuses/Orbits: No acute finding. Other: None. IMPRESSION: No abnormal enhancement is seen in the pons or along the bilateral trigeminal nerves. No abnormal enhancement along the bilateral seventh and eighth cranial nerves. Electronically Signed   By: AMerilyn BabaM.D.   On: 03/04/2022 04:04   MR BRAIN WO CONTRAST  Result Date: 03/03/2022 CLINICAL DATA:  Right-sided facial weakness and pain EXAM: MRI HEAD WITHOUT CONTRAST TECHNIQUE: Multiplanar, multiecho pulse sequences of the brain and surrounding structures were obtained without intravenous contrast.  COMPARISON:  01/03/2022 FINDINGS: Brain: Decreased size and resolved diffusion signal at the presumed infarct at the right internal capsule seen on prior. No acute infarct, hemorrhage, hydrocephalus, or masslike finding. Negative for collection or atrophy. There is linear T2 hyperintensity in the bilateral brachium pontis/lateral pons, oriented along the trajectory of the trigeminal nerves, see coronal T2 weighted imaging and axial FLAIR images. Vascular: Major flow voids are preserved Skull and upper cervical  spine: No acute finding Sinuses/Orbits: No acute finding IMPRESSION: 1. Symmetric abnormal signal in the brainstem oriented along the fibers of the trigeminal nerves, unusually focal and symmetric for demyelinating disease, question recent herpetic illness. Consider postcontrast assessment 2. No acute infarct. Electronically Signed   By: Jorje Guild M.D.   On: 03/03/2022 19:51    Subjective: Patient was seen and examined at bedside.  Overnight events noted. Patient reports complete resolution of symptoms,  denies any numbness or pain.   He is able to eat swallow and chew food well. Patient being discharged home on current medications  Discharge Exam: Vitals:   03/06/22 0726 03/06/22 0807  BP: (!) 153/98 (!) 152/93  Pulse: 86 79  Resp: 16 16  Temp: 97.8 F (36.6 C) 98.2 F (36.8 C)  SpO2: 99% 99%   Vitals:   03/05/22 2013 03/06/22 0515 03/06/22 0726 03/06/22 0807  BP: (!) 144/83 (!) 160/98 (!) 153/98 (!) 152/93  Pulse: 88 81 86 79  Resp: _0 Temp: 97.7 F (36.5 C) (!) 97.5 F (36.4 C) 97.8 F (36.6 C) 98.2 F (36.8 C)  TempSrc: Oral   Oral  SpO2: 94% 99% 99% 99%  Weight:      Height:        General: Pt is alert, awake, not in acute distress Cardiovascular: RRR, S1/S2 +, no rubs, no gallops Respiratory: CTA bilaterally, no wheezing, no rhonchi Abdominal: Soft, NT, ND, bowel sounds + Extremities: no edema, no cyanosis    The results of significant diagnostics from this hospitalization (including imaging, microbiology, ancillary and laboratory) are listed below for reference.     Microbiology: No results found for this or any previous visit (from the past 240 hour(s)).   Labs: BNP (last 3 results) Recent Labs    01/01/22 1327 01/02/22 2134  BNP 145.4* 631.4*   Basic Metabolic Panel: Recent Labs  Lab 03/04/22 0016 03/04/22 0535 03/05/22 0526 03/06/22 0344  NA 137 139 139 140  K 3.9 3.9 3.9 4.2  CL 102 105 107 106  CO2 _1 GLUCOSE  198* 182* 164* 176*  BUN _2 CREATININE 0.81 0.85 0.87 0.96  CALCIUM 9.3 9.0 8.8* 9.1   Liver Function Tests: No results for input(s): "AST", "ALT", "ALKPHOS", "BILITOT", "PROT", "ALBUMIN" in the last 168 hours. No results for input(s): "LIPASE", "AMYLASE" in the last 168 hours. No results for input(s): "AMMONIA" in the last 168 hours. CBC: Recent Labs  Lab 03/04/22 0016 03/04/22 0535  WBC 4.6 3.9*  HGB 14.3 13.1  HCT 46.6 43.1  MCV 79.9* 80.3  PLT 206 175   Cardiac Enzymes: No results for input(s): "CKTOTAL", "CKMB", "CKMBINDEX", "TROPONINI" in the last 168 hours. BNP: Invalid input(s): "POCBNP" CBG: Recent Labs  Lab 03/03/22 1739  GLUCAP 137*   D-Dimer No results for input(s): "DDIMER" in the last 72 hours. Hgb A1c No results for input(s): "HGBA1C" in the last 72 hours. Lipid Profile No results for input(s): "CHOL", "HDL", "LDLCALC", "TRIG", "CHOLHDL", "LDLDIRECT" in the last  72 hours. Thyroid function studies No results for input(s): "TSH", "T4TOTAL", "T3FREE", "THYROIDAB" in the last 72 hours.  Invalid input(s): "FREET3" Anemia work up No results for input(s): "VITAMINB12", "FOLATE", "FERRITIN", "TIBC", "IRON", "RETICCTPCT" in the last 72 hours. Urinalysis No results found for: "COLORURINE", "APPEARANCEUR", "LABSPEC", "PHURINE", "GLUCOSEU", "HGBUR", "BILIRUBINUR", "KETONESUR", "PROTEINUR", "UROBILINOGEN", "NITRITE", "LEUKOCYTESUR" Sepsis Labs Recent Labs  Lab 03/04/22 0016 03/04/22 0535  WBC 4.6 3.9*   Microbiology No results found for this or any previous visit (from the past 240 hour(s)).   Time coordinating discharge: Over 30 minutes  SIGNED:   Shawna Clamp, MD  [facility] 03/06/2022, 11:00 AM Pager   If 7PM-7AM, please contact night-coverage

## 2022-03-06 NOTE — Progress Notes (Signed)
Patient being discharged home. IV removed. Went over discharge instructions and medications with patient. Patient stated that he understood. Patient going home POV with family.

## 2022-03-06 NOTE — Progress Notes (Signed)
Calorie Count Note  48 hour calorie count ordered.  Diet: dysphagia 3 diet Supplements: Ensure Enlive po TID, each supplement provides 350 kcal and 20 grams of protein.   10/25-10/26 Breakfast: 491 kcals, 20 grams protein Lunch: 622 kcals, 20 grams protein Dinner: 431 kcals 23 grams protein   Total intake: 1544 kcal (69% of minimum estimated needs)  63 grams protein (48% of minimum estimated needs)  10/26-10/27 Breakfast: nothing documented Lunch: 711 kcals, 32 grams protein Dinner: nothing documented  RN in room giving pt discharge instructions. Noted completed Ensure at bedside.   Nutrition Dx: Inadequate oral intake related to poor appetite (facial pain) as evidenced by per patient/family report; ongoing  Goal: Patient will meet greater than or equal to 90% of their needs; progressing   Intervention:  -D/c calorie count -Continue Ensure Enlive po TID, each supplement provides 350 kcal and 20 grams of protein -Continue MVI with minerals daily -Continue dysphagia 3 diet  Loistine Chance, RD, LDN, [place] Registered Dietitian II Certified Diabetes Care and Education Specialist Please refer to [facility] for RD and/or RD on-call/weekend/after hours pager

## 2022-03-06 NOTE — Progress Notes (Signed)
Neurology Progress Note  Patient ID: Edwin Edwards is a 61 y.o. with PMHx of  has a past medical history of CHF (congestive heart failure) ([place]), Diabetes mellitus without complication ([place]), Hypertension, and Stroke ([place]).  Subjective: Symptoms resolved, smiling and eating well  Exam: Vitals:   03/06/22 0726 03/06/22 0807  BP: (!) 153/98 (!) 152/93  Pulse: 86 79  Resp: 16 16  Temp: 97.8 F (36.6 C) 98.2 F (36.8 C)  SpO2: 99% 99%   Gen: In bed, comfortable  Resp: non-labored breathing, no grossly audible wheezing Cardiac: Perfusing extremities well  Abd: soft, nt  Neuro: MS: Awake, alert, appropriately conversant with fluent speech and following simple commands CN: Improve movement of the right face today, smiling more on the right than the left (casually). Speaking clearly Motor: Using all 4 extremities grossly equally  Pertinent Labs:  Basic Metabolic Panel: Recent Labs  Lab 03/04/22 0016 03/04/22 0535 03/05/22 0526 03/06/22 0344  NA 137 139 139 140  K 3.9 3.9 3.9 4.2  CL 102 105 107 106  CO2 25 28 25 27   GLUCOSE 198* 182* 164* 176*  BUN 9 8 9 15   CREATININE 0.81 0.85 0.87 0.96  CALCIUM 9.3 9.0 8.8* 9.1     CBC: Recent Labs  Lab 03/04/22 0016 03/04/22 0535  WBC 4.6 3.9*  HGB 14.3 13.1  HCT 46.6 43.1  MCV 79.9* 80.3  PLT 206 175     Coagulation Studies: No results for input(s): "LABPROT", "INR" in the last 72 hours.    Impression: Now well controlled, extended discussion with patient at bedside about the fact there is room to further increase medications if needed, and that he will need a level checked for carbamazepine and continued outpatient follow-up with neurology  Recommendations: -Continue gabapentin 600/600/1200, may wean outpatient if desired -Continue carbamazepine 600 twice daily, level check outpatient and follow BMP outpatient -No need for neurosurgical eval at this time but patient informed this may be a future option if symptoms  are uncontrolled on maximal medical therapy or if side effects limit medication titration -Neurology will sign off at this time as patient is appropriate for discharge -Continue outpatient follow-up with Dr. Moishe Spice MD-PhD [facility] [phone]  [facility] coverage for [facility] is from 8 AM to 4 AM in-house and 4 PM to 8 PM by telephone/video. 8 PM to 8 AM emergent questions or overnight urgent questions should be addressed to Teleneurology On-call or [facility] neurohospitalist; contact information can be found on AMION

## 2022-03-06 NOTE — TOC CM/SW Note (Signed)
  Transition of Care [facility]) Screening Note   Patient Details  Name: Edwin Edwards Date of Birth: 20-Mar-1961   Transition of Care [facility]) CM/SW Contact:    Colen Darling, [place] Phone Number: 03/06/2022, 9:48 AM    Transition of Care Department [facility]) has reviewed patient and no TOC needs have been identified at this time. We will continue to monitor patient advancement through interdisciplinary progression rounds. If new patient transition needs arise, please place a TOC consult.

## 2022-03-06 NOTE — Discharge Instructions (Signed)
Advised to follow-up with Neurology as scheduled. Advised to take carbamazepine 600 mg twice daily for trigeminal neuralgia. Advised to take gabapentin 600 mg in the morning, 600 mg in the lunch and 200 mg at bedtime for trigeminal neuralgia. Advised to follow-up with neuro surgery as scheduled

## 2022-03-10 ENCOUNTER — Ambulatory Visit: Payer: BC Managed Care – PPO | Admitting: Family

## 2022-03-10 ENCOUNTER — Telehealth: Payer: Self-pay | Admitting: Family

## 2022-03-10 NOTE — Telephone Encounter (Signed)
Patient did not show for his Heart Failure Clinic appointment on 03/10/22. Will attempt to reschedule.

## 2022-03-24 ENCOUNTER — Ambulatory Visit: Payer: BC Managed Care – PPO | Attending: Medical | Admitting: Medical

## 2022-03-24 NOTE — Progress Notes (Deleted)
Cardiology Office Note:    Date:  03/24/2022   ID:  Edwin Edwards, DOB 01-10-1961, MRN [id number]  PCP:  Patient, No Pcp Per  [facility] Cardiologist:  Debbe Odea, MD  [facility] Electrophysiologist:  None   Referring MD: Phineas Semen, MD   Chief Complaint: Hospital follow-up  History of Present Illness:    Edwin Edwards is a 61 y.o. male with a hx of hypertension, diabetes who is being seen for hospital follow-up.  Patient was admitted in August with worsening shortness of breath and leg edema.  Chest x-ray showed pulmonary edema and pleural effusions.  High-sensitivity troponins were 32, 27.  BNP 145.  He was started on IV Lasix.  Echocardiogram showed LVEF 50 to 55%, no wall motion abnormalities, and no valvular abnormalities.  Patient reported headache MRI MRI of the brain was ordered.  This showed acute infarct of the right internal capsule.  Patient was started on aspirin and Plavix for 21 days then drop down to aspirin only.  Since that time he has multiple ER visits for dyspnea and facial pain.  Today,  Past Medical History:  Diagnosis Date   CHF (congestive heart failure) (HCC)    Diabetes mellitus without complication (H[facility])    Hypertension    Stroke [facility])     Past Surgical History:  Procedure Laterality Date   ABDOMINAL SURGERY     COLON SURGERY      Current Medications: No outpatient medications have been marked as taking for the 03/24/22 encounter (Appointment) with Fransico Michael, Raihana Balderrama H, PA-C.     Allergies:   Patient has no known allergies.   Social History   Socioeconomic History   Marital status: Single    Spouse name: Not on file   Number of children: Not on file   Years of education: Not on file   Highest education level: Not on file  Occupational History   Not on file  Tobacco Use   Smoking status: Never   Smokeless tobacco: Never  Substance and Sexual Activity   Alcohol use: Not Currently   Drug use: Not Currently    Sexual activity: Not on file  Other Topics Concern   Not on file  Social History Narrative   Not on file   Social Determinants of Health   Financial Resource Strain: Not on file  Food Insecurity: No Food Insecurity (03/04/2022)   Hunger Vital Sign    Worried About Running Out of Food in the Last Year: Never true    Ran Out of Food in the Last Year: Never true  Transportation Needs: No Transportation Needs (03/04/2022)   PRAPARE - Administrator, Civil Service (Medical): No    Lack of Transportation (Non-Medical): No  Physical Activity: Not on file  Stress: Not on file  Social Connections: Not on file     Family History: The patient's ***family history includes Diabetes in his mother; Heart attack in his mother; Hypertension in his mother.  ROS:   Please see the history of present illness.    *** All other systems reviewed and are negative.  EKGs/Labs/Other Studies Reviewed:    The following studies were reviewed today: ***  EKG:  EKG is *** ordered today.  The ekg ordered today demonstrates ***  Recent Labs: 01/02/2022: ALT 27; B Natriuretic Peptide 140.7 01/04/2022: Magnesium 2.0 03/04/2022: Hemoglobin 13.1; Platelets 175 03/06/2022: BUN 15; Creatinine, Ser 0.96; Potassium 4.2; Sodium 140  Recent Lipid Panel    Component Value Date/Time  CHOL 180 01/03/2022 1027   TRIG 105 01/03/2022 1027   HDL 43 01/03/2022 1027   CHOLHDL 4.2 01/03/2022 1027   VLDL 21 01/03/2022 1027   LDLCALC 116 (H) 01/03/2022 1027     Risk Assessment/Calculations:   {Does this patient have ATRIAL FIBRILLATION?[phone]}   Physical Exam:    VS:  There were no vitals taken for this visit.    Wt Readings from Last 3 Encounters:  03/03/22 (!) 320 lb (145.2 kg)  01/27/22 (!) 313 lb 8 oz (142.2 kg)  01/12/22 (!) 310 lb 10.1 oz (140.9 kg)     GEN: *** Well nourished, well developed in no acute distress HEENT: Normal NECK: No JVD; No carotid bruits LYMPHATICS: No  lymphadenopathy CARDIAC: ***RRR, no murmurs, rubs, gallops RESPIRATORY:  Clear to auscultation without rales, wheezing or rhonchi  ABDOMEN: Soft, non-tender, non-distended MUSCULOSKELETAL:  No edema; No deformity  SKIN: Warm and dry NEUROLOGIC:  Alert and oriented x 3 PSYCHIATRIC:  Normal affect   ASSESSMENT:    No diagnosis found. PLAN:    In order of problems listed above:  ***  Disposition: Follow up {follow up:15908} with ***   Shared Decision Making/Informed Consent   {Are you ordering a CV Procedure (e.g. stress test, cath, DCCV, TEE, etc)?   Press F2        :[id number]}    Signed, Rosselyn Martha David Stall, PA-C  03/24/2022 8:14 AM    [facility]

## 2022-03-25 ENCOUNTER — Encounter: Payer: Self-pay | Admitting: Medical

## 2023-06-25 IMAGING — CT CT HEAD W/O CM
4 series · 16 of 47 positions shown, 18 images · non-contrast
Comparison: None Available.

CLINICAL DATA: Headache, new or worsening (Age >= 50y)



[Series 2: head bone · axial · 0.44mm/px · z∈[-132,-100]mm · 3 of 84 slices shown]
[im 9/84  bone]
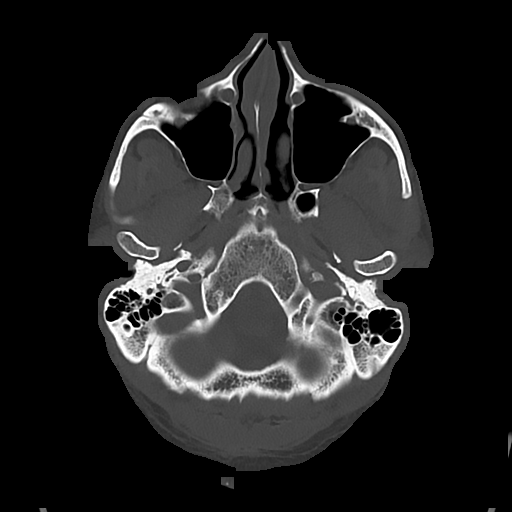
[im 17/84  bone]
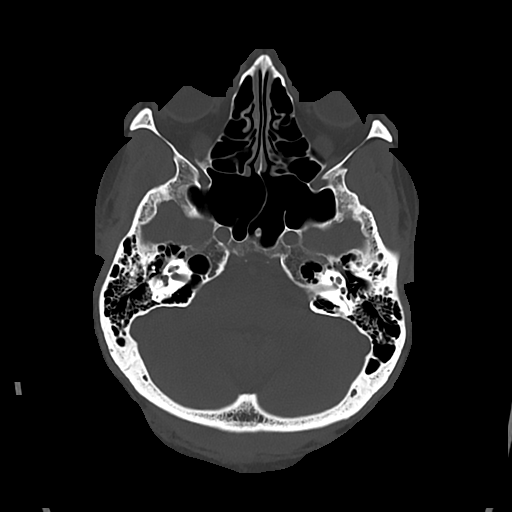
[im 25/84  bone]
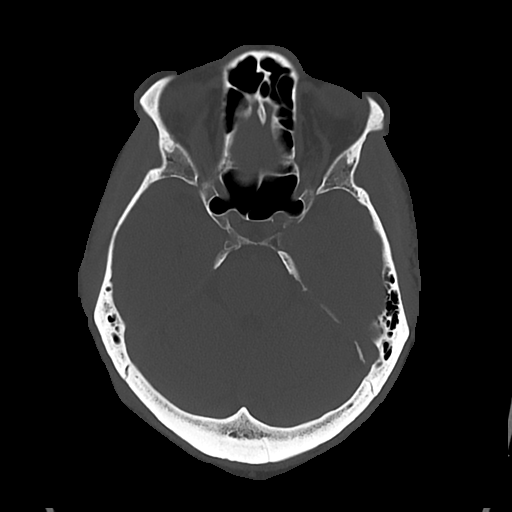

[Series 3: head wo · axial · 0.44mm/px · z∈[-128,-8]mm · 7 of 34 slices shown, 9 images]
[im 5/34  brain]
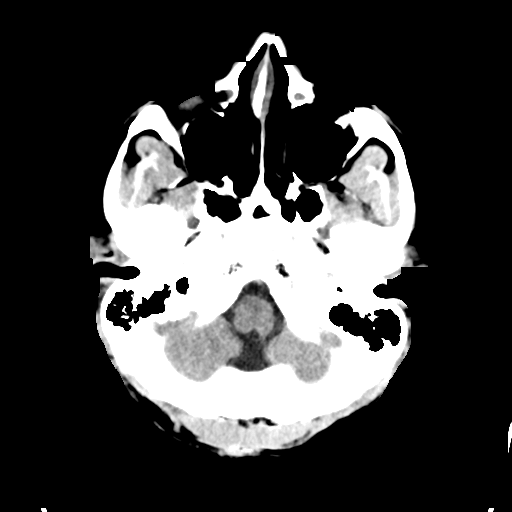
[im 5/34  bone]
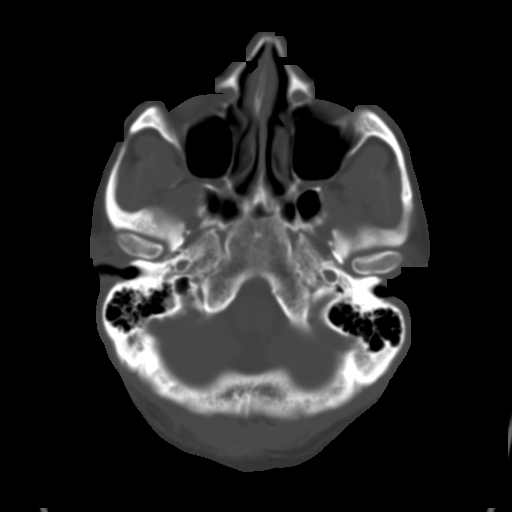
[im 9/34  brain]
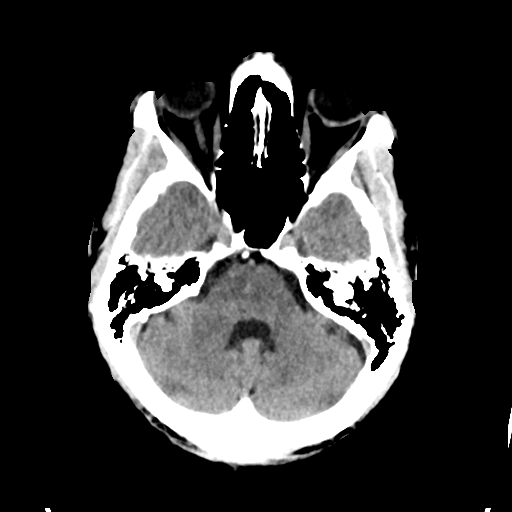
[im 13/34  brain]
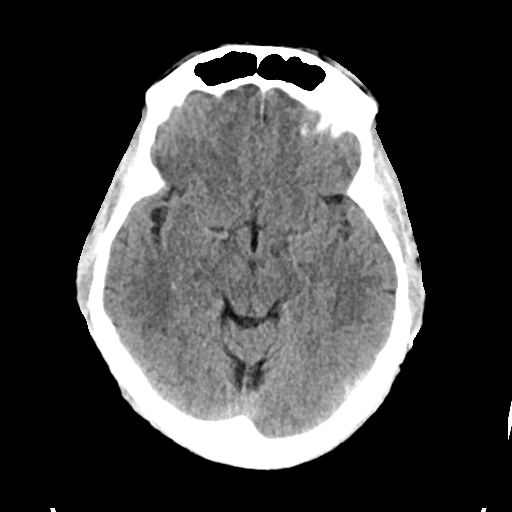
[im 17/34  brain]
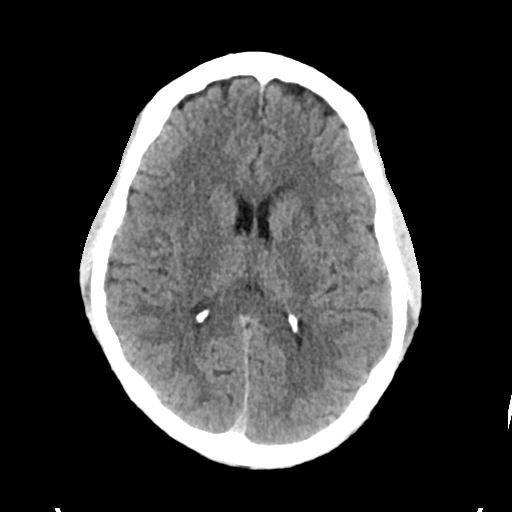
[im 21/34  brain]
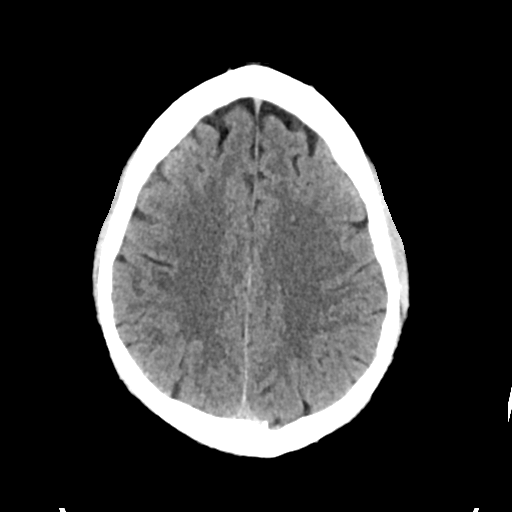
[im 21/34  bone]
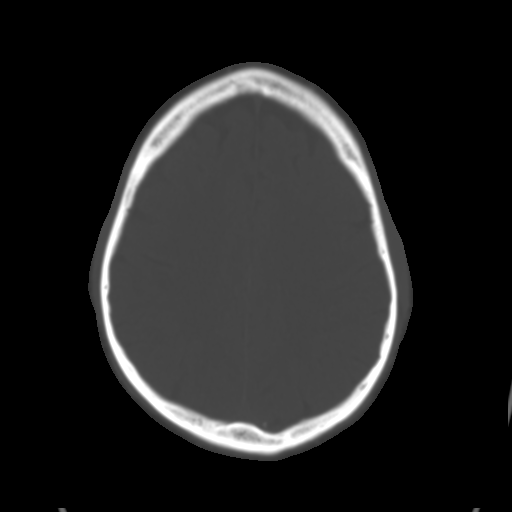
[im 25/34  brain]
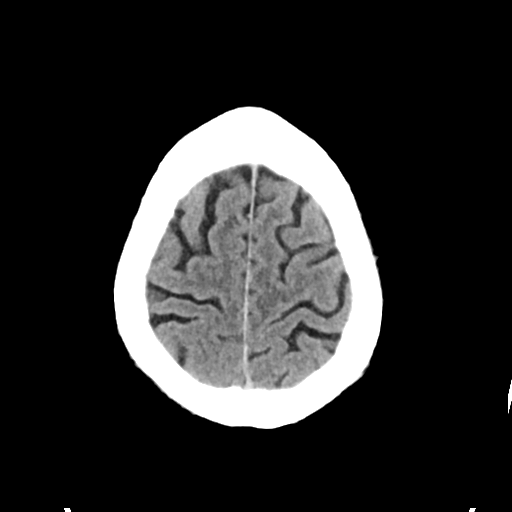
[im 29/34  brain]
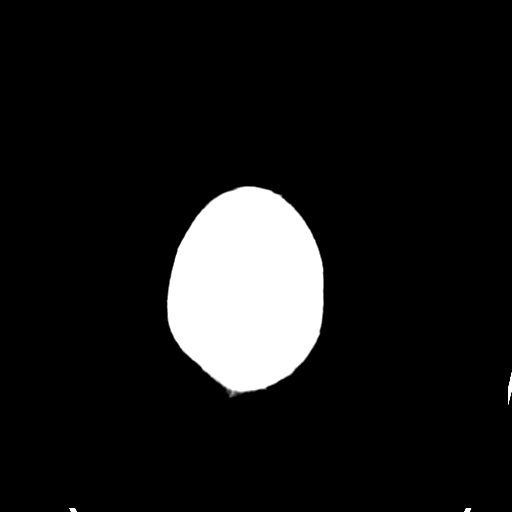

[Series 4: cor soft · coronal · 0.35mm/px · 3 of 75 slices shown]
[im 25/75  brain]
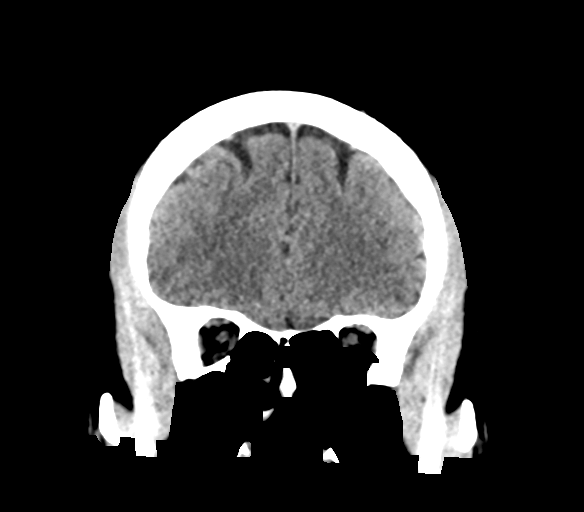
[im 33/75  brain]
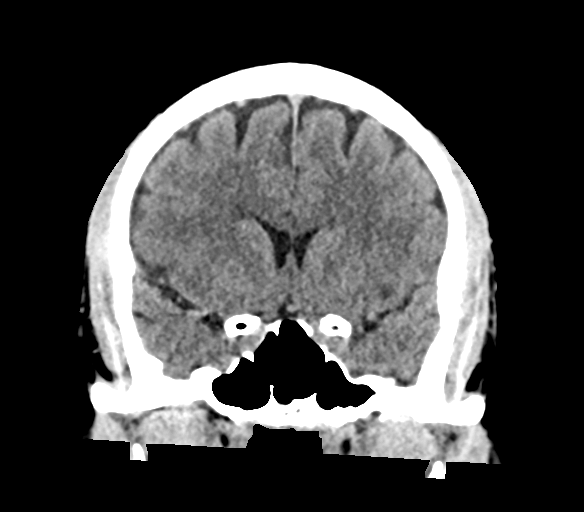
[im 42/75  brain]
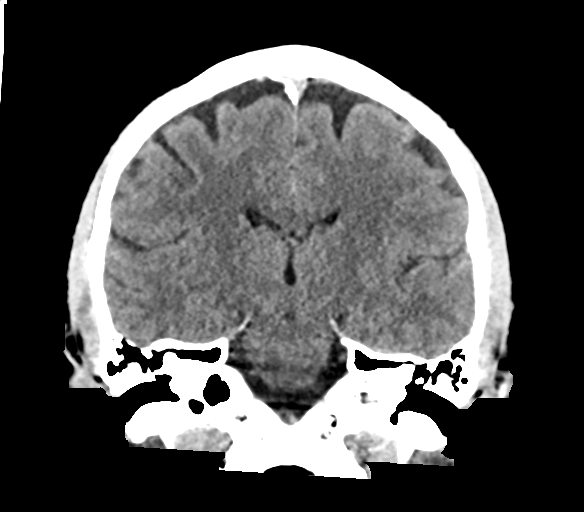

[Series 5: sag soft · sagittal · 0.35mm/px · 3 of 63 slices shown]
[im 21/63  brain]
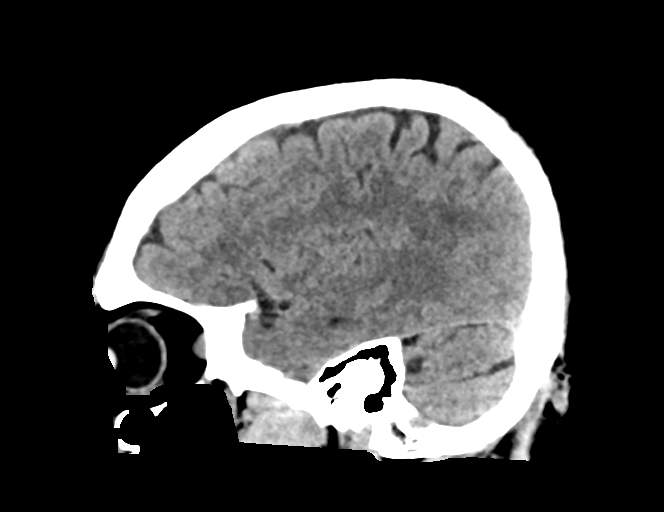
[im 32/63  brain]
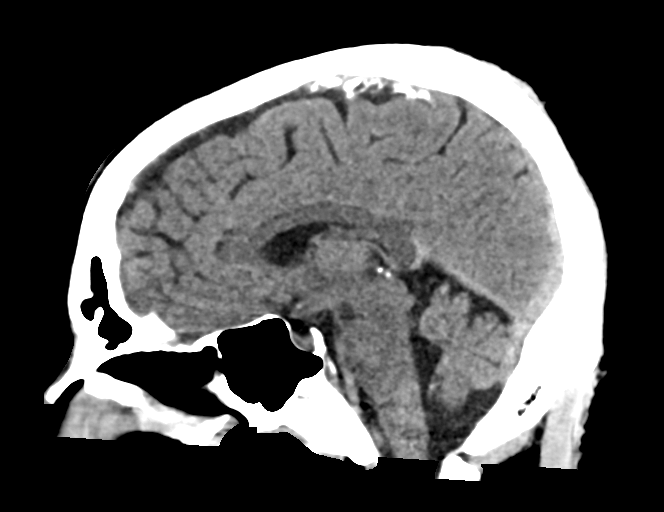
[im 42/63  brain]
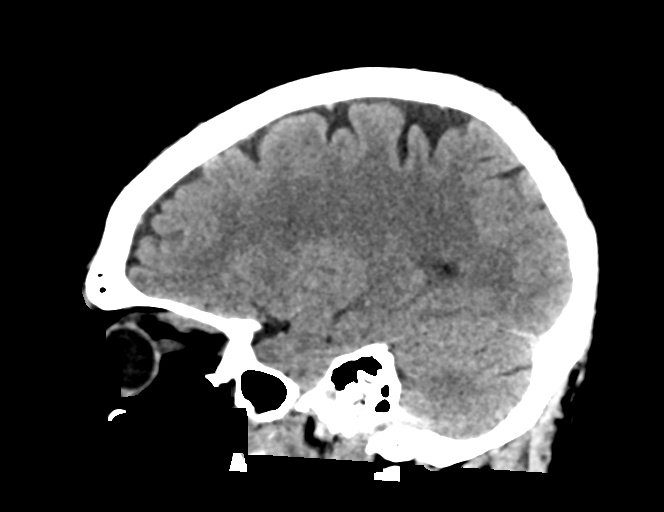

[16 of 47 positions shown; findings below may reference images not displayed]

FINDINGS: Brain: No evidence of acute infarction, hemorrhage, hydrocephalus,
extra-axial collection or mass lesion/mass effect. Mild white matter
hypodensities, nonspecific but compatible with chronic microvascular
ischemic disease.

Vascular: No hyperdense vessel identified.

Skull: No acute fracture.

Sinuses/Orbits: Clear sinuses.  No acute orbital findings.

Other: No mastoid effusions.
IMPRESSION: No evidence of acute intracranial abnormality.
